# Patient Record
Sex: Female | Born: 1980 | Race: White | Hispanic: No | Marital: Married | Smoking: Former smoker
Health system: Southern US, Community
[De-identification: ages and names within clinical notes are randomized; demographics above are authoritative.]

## PROBLEM LIST (undated history)

## (undated) ENCOUNTER — Inpatient Hospital Stay (HOSPITAL_COMMUNITY): Payer: Self-pay

## (undated) DIAGNOSIS — M1990 Unspecified osteoarthritis, unspecified site: Secondary | ICD-10-CM

## (undated) DIAGNOSIS — F32A Depression, unspecified: Secondary | ICD-10-CM

## (undated) DIAGNOSIS — F419 Anxiety disorder, unspecified: Secondary | ICD-10-CM

## (undated) DIAGNOSIS — F329 Major depressive disorder, single episode, unspecified: Secondary | ICD-10-CM

## (undated) DIAGNOSIS — K760 Fatty (change of) liver, not elsewhere classified: Secondary | ICD-10-CM

## (undated) DIAGNOSIS — T7840XA Allergy, unspecified, initial encounter: Secondary | ICD-10-CM

## (undated) DIAGNOSIS — D219 Benign neoplasm of connective and other soft tissue, unspecified: Secondary | ICD-10-CM

## (undated) DIAGNOSIS — K219 Gastro-esophageal reflux disease without esophagitis: Secondary | ICD-10-CM

## (undated) DIAGNOSIS — E669 Obesity, unspecified: Secondary | ICD-10-CM

## (undated) DIAGNOSIS — I10 Essential (primary) hypertension: Secondary | ICD-10-CM

## (undated) DIAGNOSIS — J42 Unspecified chronic bronchitis: Secondary | ICD-10-CM

## (undated) DIAGNOSIS — O42919 Preterm premature rupture of membranes, unspecified as to length of time between rupture and onset of labor, unspecified trimester: Secondary | ICD-10-CM

## (undated) DIAGNOSIS — E785 Hyperlipidemia, unspecified: Secondary | ICD-10-CM

## (undated) HISTORY — DX: Fatty (change of) liver, not elsewhere classified: K76.0

## (undated) HISTORY — DX: Hyperlipidemia, unspecified: E78.5

## (undated) HISTORY — DX: Allergy, unspecified, initial encounter: T78.40XA

## (undated) HISTORY — DX: Obesity, unspecified: E66.9

## (undated) HISTORY — DX: Depression, unspecified: F32.A

## (undated) HISTORY — DX: Gastro-esophageal reflux disease without esophagitis: K21.9

## (undated) HISTORY — DX: Major depressive disorder, single episode, unspecified: F32.9

## (undated) HISTORY — DX: Preterm premature rupture of membranes, unspecified as to length of time between rupture and onset of labor, unspecified trimester: O42.919

## (undated) HISTORY — DX: Unspecified osteoarthritis, unspecified site: M19.90

---

## 2000-08-13 ENCOUNTER — Encounter: Payer: Self-pay | Admitting: Emergency Medicine

## 2000-08-13 ENCOUNTER — Emergency Department (HOSPITAL_COMMUNITY): Admission: EM | Admit: 2000-08-13 | Discharge: 2000-08-13 | Payer: Self-pay | Admitting: Emergency Medicine

## 2000-12-23 ENCOUNTER — Other Ambulatory Visit: Admission: RE | Admit: 2000-12-23 | Discharge: 2000-12-23 | Payer: Self-pay | Admitting: Obstetrics and Gynecology

## 2005-08-27 ENCOUNTER — Other Ambulatory Visit: Admission: RE | Admit: 2005-08-27 | Discharge: 2005-08-27 | Payer: Self-pay | Admitting: Internal Medicine

## 2006-05-27 HISTORY — PX: CHOLECYSTECTOMY: SHX55

## 2006-08-08 ENCOUNTER — Encounter (INDEPENDENT_AMBULATORY_CARE_PROVIDER_SITE_OTHER): Payer: Self-pay | Admitting: *Deleted

## 2006-08-08 ENCOUNTER — Encounter (INDEPENDENT_AMBULATORY_CARE_PROVIDER_SITE_OTHER): Payer: Self-pay | Admitting: Specialist

## 2006-08-08 ENCOUNTER — Ambulatory Visit (HOSPITAL_COMMUNITY): Admission: RE | Admit: 2006-08-08 | Discharge: 2006-08-08 | Payer: Self-pay | Admitting: Surgery

## 2008-05-27 HISTORY — PX: WISDOM TOOTH EXTRACTION: SHX21

## 2008-07-12 ENCOUNTER — Encounter (INDEPENDENT_AMBULATORY_CARE_PROVIDER_SITE_OTHER): Payer: Self-pay | Admitting: *Deleted

## 2008-07-14 ENCOUNTER — Ambulatory Visit: Payer: Self-pay | Admitting: *Deleted

## 2008-07-14 DIAGNOSIS — F411 Generalized anxiety disorder: Secondary | ICD-10-CM | POA: Insufficient documentation

## 2008-07-14 DIAGNOSIS — K219 Gastro-esophageal reflux disease without esophagitis: Secondary | ICD-10-CM | POA: Insufficient documentation

## 2008-07-14 DIAGNOSIS — K921 Melena: Secondary | ICD-10-CM | POA: Insufficient documentation

## 2008-07-14 DIAGNOSIS — R5383 Other fatigue: Secondary | ICD-10-CM | POA: Insufficient documentation

## 2008-07-14 DIAGNOSIS — R002 Palpitations: Secondary | ICD-10-CM | POA: Insufficient documentation

## 2008-07-19 LAB — CONVERTED CEMR LAB
ALT: 49 units/L — ABNORMAL HIGH (ref 0–35)
AST: 33 units/L (ref 0–37)
Basophils Absolute: 0 10*3/uL (ref 0.0–0.1)
Basophils Relative: 0.6 % (ref 0.0–3.0)
CO2: 25 meq/L (ref 19–32)
Creatinine, Ser: 0.8 mg/dL (ref 0.4–1.2)
Eosinophils Absolute: 0.1 10*3/uL (ref 0.0–0.7)
GFR calc Af Amer: 109 mL/min
Hemoglobin: 14.7 g/dL (ref 12.0–15.0)
MCHC: 34.8 g/dL (ref 30.0–36.0)
MCV: 85.6 fL (ref 78.0–100.0)
Monocytes Absolute: 0.4 10*3/uL (ref 0.1–1.0)
Neutro Abs: 4.8 10*3/uL (ref 1.4–7.7)
RBC: 4.94 M/uL (ref 3.87–5.11)
Total Bilirubin: 1.3 mg/dL — ABNORMAL HIGH (ref 0.3–1.2)

## 2008-07-29 ENCOUNTER — Ambulatory Visit: Payer: Self-pay | Admitting: Gastroenterology

## 2008-07-29 DIAGNOSIS — R7401 Elevation of levels of liver transaminase levels: Secondary | ICD-10-CM | POA: Insufficient documentation

## 2008-07-29 DIAGNOSIS — K625 Hemorrhage of anus and rectum: Secondary | ICD-10-CM | POA: Insufficient documentation

## 2008-07-29 DIAGNOSIS — R109 Unspecified abdominal pain: Secondary | ICD-10-CM | POA: Insufficient documentation

## 2008-07-29 DIAGNOSIS — K5900 Constipation, unspecified: Secondary | ICD-10-CM | POA: Insufficient documentation

## 2008-07-29 DIAGNOSIS — R740 Nonspecific elevation of levels of transaminase and lactic acid dehydrogenase [LDH]: Secondary | ICD-10-CM

## 2008-08-01 ENCOUNTER — Telehealth: Payer: Self-pay | Admitting: Gastroenterology

## 2008-08-01 LAB — CONVERTED CEMR LAB
ALT: 52 units/L — ABNORMAL HIGH (ref 0–35)
Bilirubin, Direct: 0.2 mg/dL (ref 0.0–0.3)
Total Bilirubin: 1.2 mg/dL (ref 0.3–1.2)

## 2008-08-02 ENCOUNTER — Ambulatory Visit: Payer: Self-pay | Admitting: Gastroenterology

## 2008-08-03 ENCOUNTER — Ambulatory Visit (HOSPITAL_COMMUNITY): Admission: RE | Admit: 2008-08-03 | Discharge: 2008-08-03 | Payer: Self-pay | Admitting: Gastroenterology

## 2008-08-03 LAB — CONVERTED CEMR LAB
Prothrombin Time: 11.9 s (ref 10.9–13.3)
Saturation Ratios: 17.4 % — ABNORMAL LOW (ref 20.0–50.0)
Transferrin: 291.1 mg/dL (ref 212.0–360.0)

## 2008-08-04 ENCOUNTER — Telehealth: Payer: Self-pay | Admitting: Gastroenterology

## 2008-08-09 ENCOUNTER — Encounter (INDEPENDENT_AMBULATORY_CARE_PROVIDER_SITE_OTHER): Payer: Self-pay | Admitting: *Deleted

## 2008-08-09 LAB — CONVERTED CEMR LAB
A-1 Antitrypsin, Ser: 148 mg/dL (ref 83–200)
Ceruloplasmin: 35 mg/dL (ref 21–63)
Hepatitis B Surface Ag: NEGATIVE
Tissue Transglutaminase Ab, IgA: 0 units (ref <7)

## 2008-08-10 ENCOUNTER — Ambulatory Visit: Payer: Self-pay | Admitting: *Deleted

## 2008-08-10 DIAGNOSIS — K7689 Other specified diseases of liver: Secondary | ICD-10-CM | POA: Insufficient documentation

## 2008-08-10 DIAGNOSIS — E785 Hyperlipidemia, unspecified: Secondary | ICD-10-CM | POA: Insufficient documentation

## 2008-08-10 DIAGNOSIS — E669 Obesity, unspecified: Secondary | ICD-10-CM | POA: Insufficient documentation

## 2008-08-10 DIAGNOSIS — E876 Hypokalemia: Secondary | ICD-10-CM | POA: Insufficient documentation

## 2008-08-10 LAB — CONVERTED CEMR LAB
Cholesterol: 200 mg/dL (ref 0–200)
VLDL: 23.2 mg/dL (ref 0.0–40.0)

## 2008-08-16 ENCOUNTER — Telehealth (INDEPENDENT_AMBULATORY_CARE_PROVIDER_SITE_OTHER): Payer: Self-pay | Admitting: *Deleted

## 2008-09-01 ENCOUNTER — Telehealth (INDEPENDENT_AMBULATORY_CARE_PROVIDER_SITE_OTHER): Payer: Self-pay | Admitting: *Deleted

## 2009-05-27 HISTORY — PX: OTHER SURGICAL HISTORY: SHX169

## 2009-10-26 IMAGING — US US ABDOMEN COMPLETE
1 series · 14 of 25 positions shown · non-contrast
Comparison: None

CLINICAL DATA: Elevated LFTs.  Down pain and nausea.  Hypertension.

ABDOMEN ULTRASOUND
TECHNIQUE: Complete abdominal ultrasound examination was performed
including evaluation of the liver, gallbladder, bile ducts,
pancreas, kidneys, spleen, IVC, and abdominal aorta.

[Series 1: us abdomen complete · 0.33mm/px · 14 of 54 slices shown]
[im 1/54]
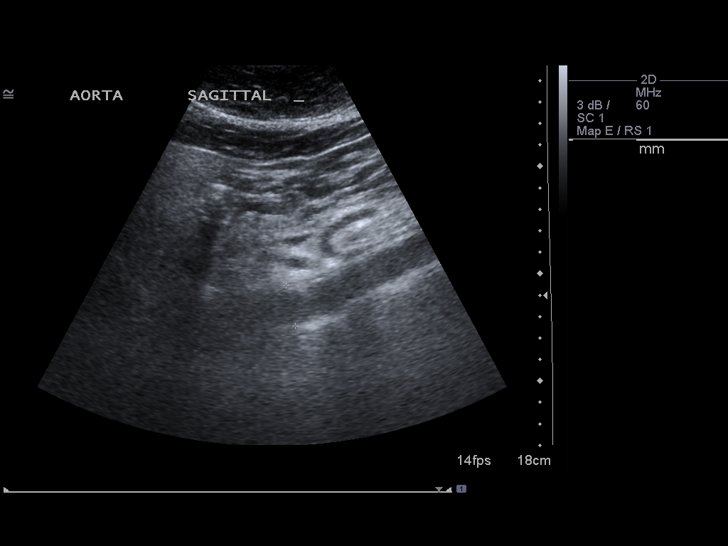
[im 5/54]
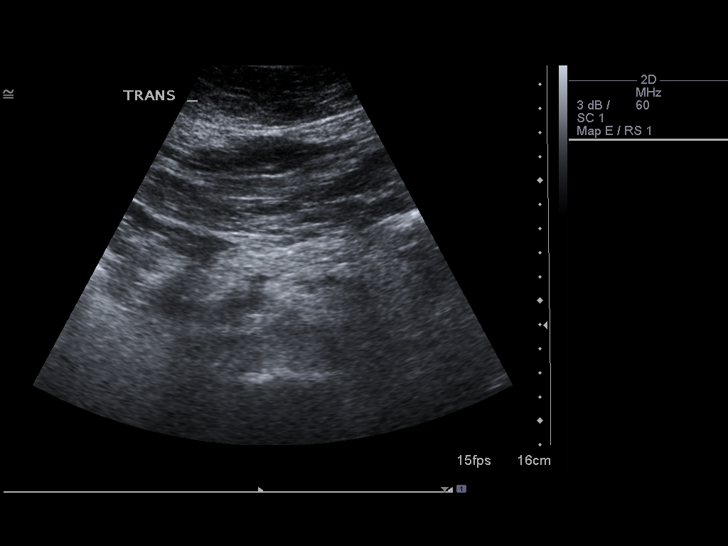
[im 9/54]
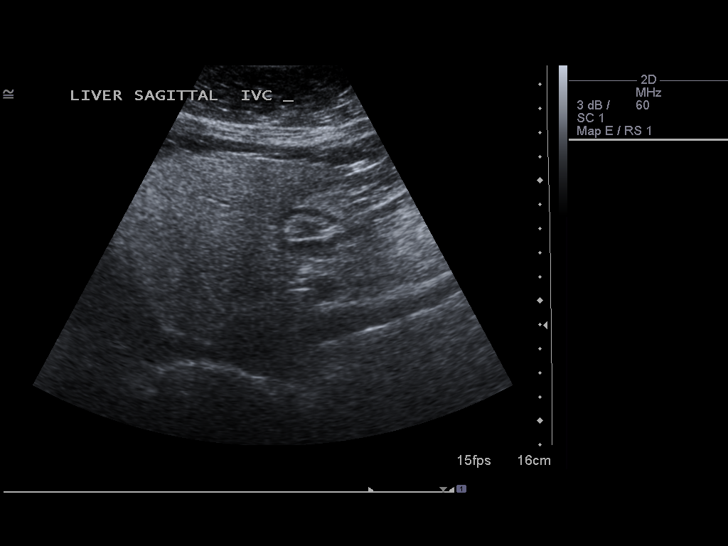
[im 14/54]
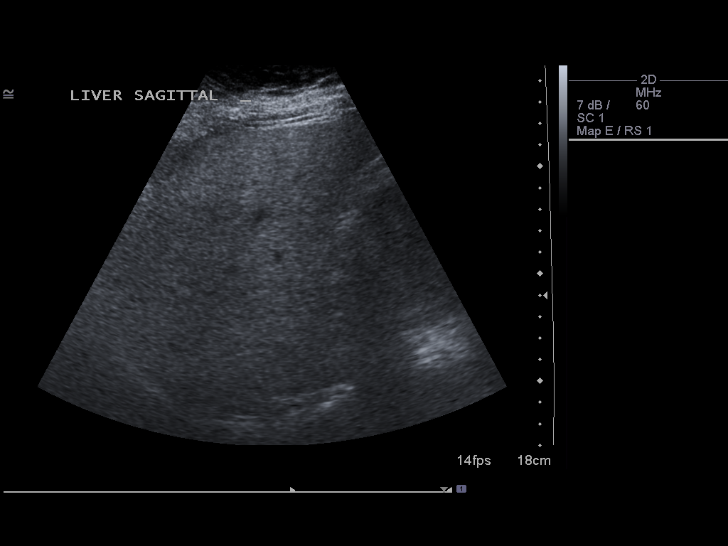
[im 18/54]
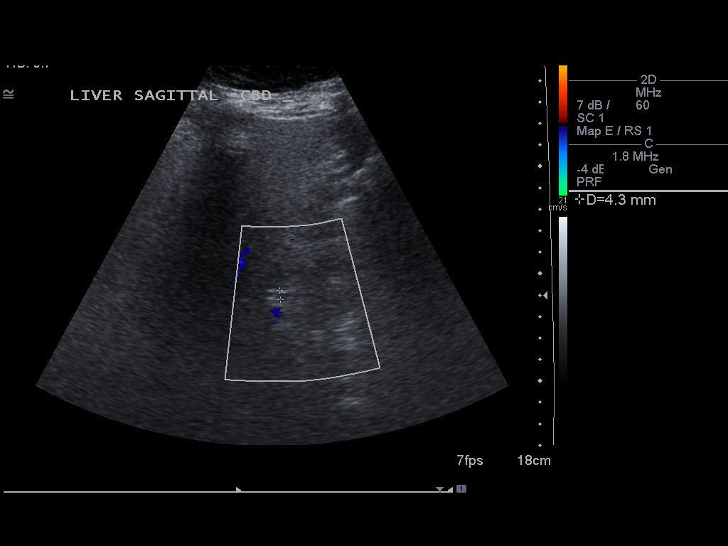
[im 20/54]
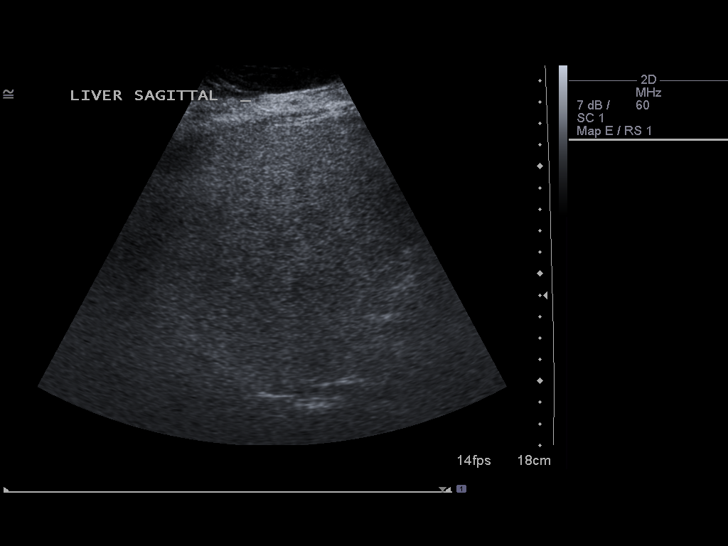
[im 25/54]
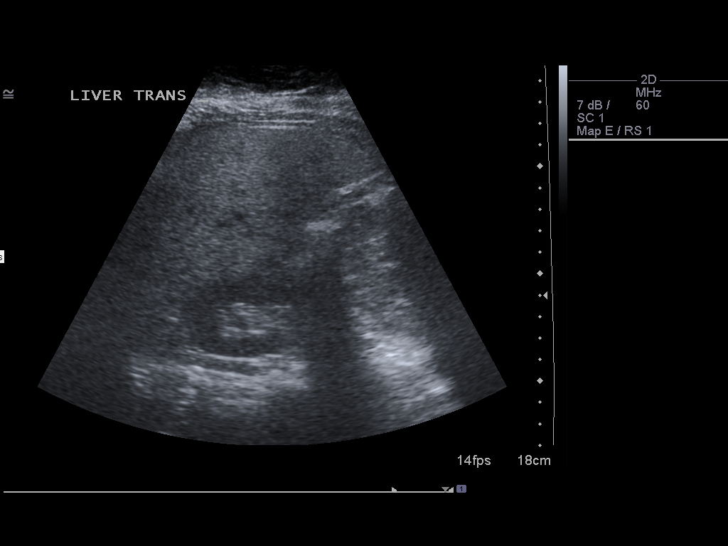
[im 29/54]
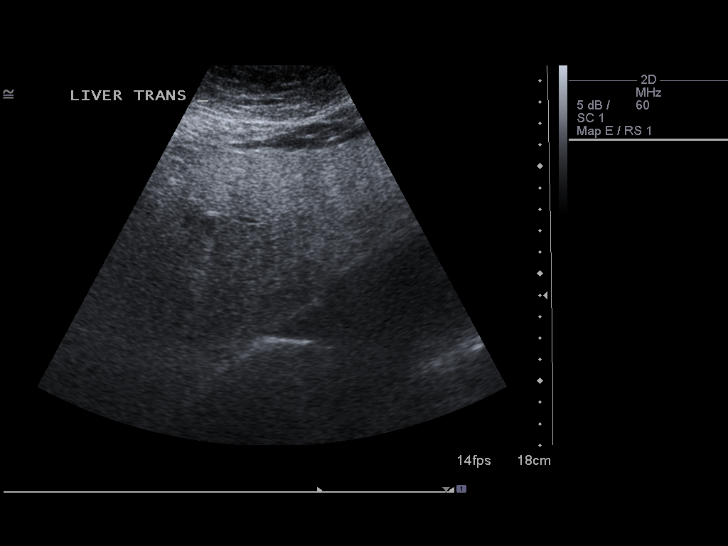
[im 34/54]
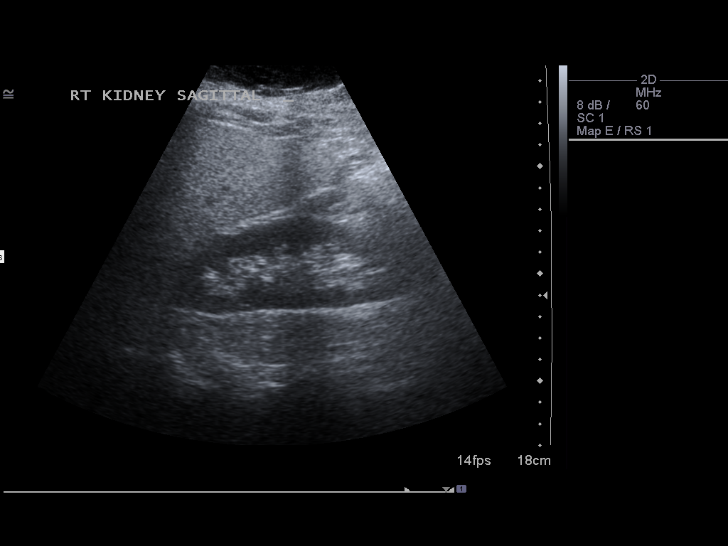
[im 36/54]
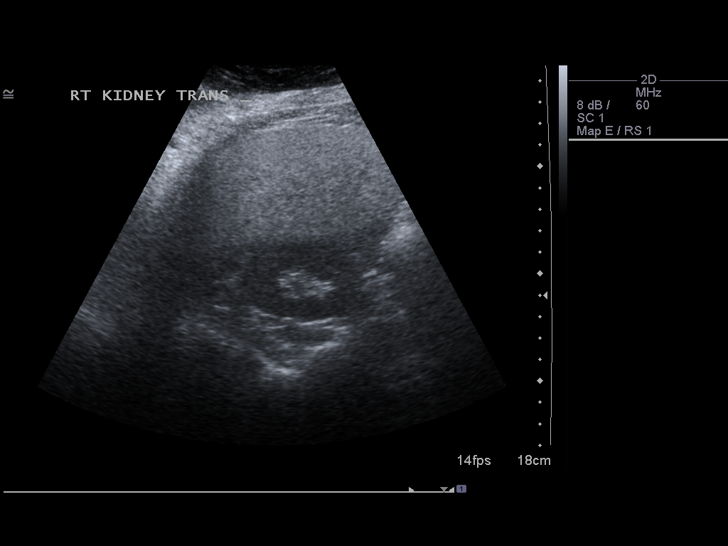
[im 40/54]
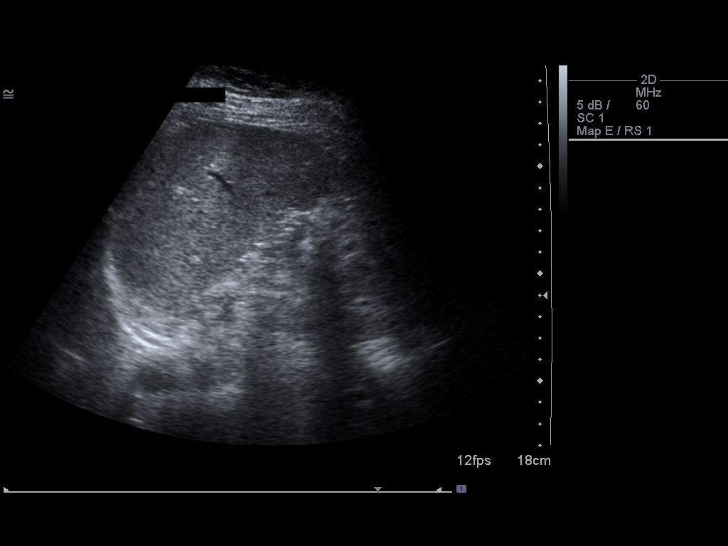
[im 45/54]
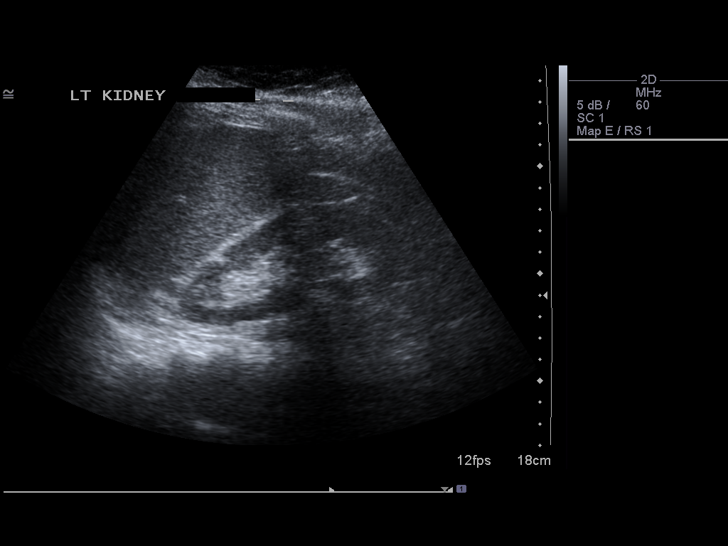
[im 49/54]
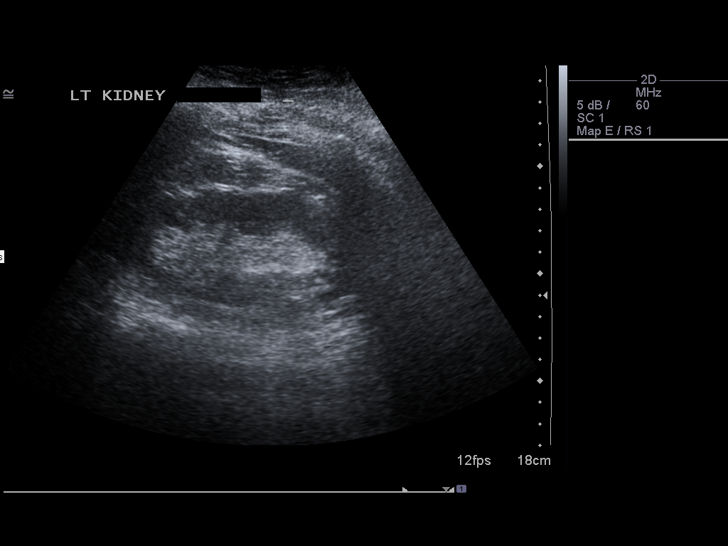
[im 54/54]
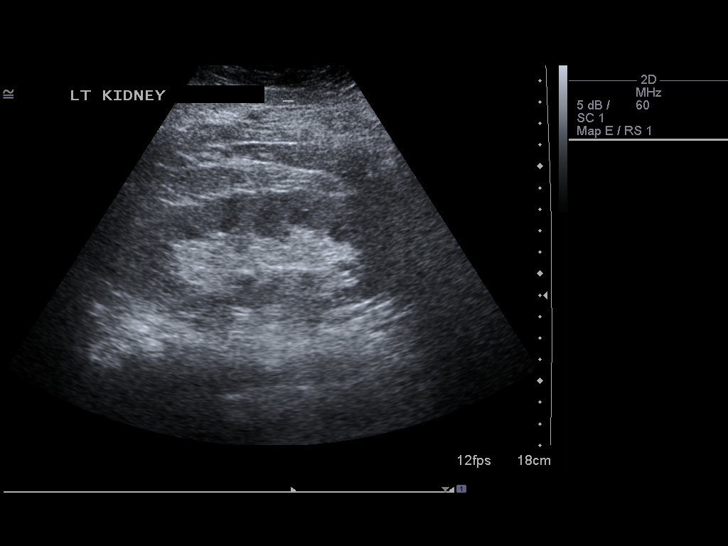

[14 of 25 positions shown; findings below may reference images not displayed]

FINDINGS: Cholecystectomy. No biliary ductal dilatation.  Common
duct measures 4.3 mm in diameter.  Hepatic echotexture is dense and
coarse compatible with diffuse hepatocellular disease, most likely
fatty infiltration.  Patent IVC.  Abdominal aortic maximum diameter
is 1.9 cm.  Spleen measures 11.5 cm in length.  The right and left
kidneys measure 12.5 cm and 12.7 cm in length, respectively.
IMPRESSION: Cholecystectomy.  No biliary ductal dilatation.  Fatty infiltration
of the liver.

## 2009-12-07 ENCOUNTER — Inpatient Hospital Stay (HOSPITAL_COMMUNITY): Admission: AD | Admit: 2009-12-07 | Discharge: 2009-12-13 | Payer: Self-pay | Admitting: Obstetrics and Gynecology

## 2009-12-07 ENCOUNTER — Encounter: Payer: Self-pay | Admitting: Obstetrics & Gynecology

## 2009-12-11 ENCOUNTER — Ambulatory Visit (HOSPITAL_COMMUNITY): Admission: RE | Admit: 2009-12-11 | Discharge: 2009-12-11 | Payer: Self-pay | Admitting: Obstetrics and Gynecology

## 2009-12-29 ENCOUNTER — Inpatient Hospital Stay (HOSPITAL_COMMUNITY): Admission: AD | Admit: 2009-12-29 | Discharge: 2009-12-31 | Payer: Self-pay | Admitting: Obstetrics and Gynecology

## 2010-01-25 DEATH — deceased

## 2010-07-16 ENCOUNTER — Ambulatory Visit: Payer: Self-pay | Admitting: Family Medicine

## 2010-08-10 LAB — CBC
HCT: 29 % — ABNORMAL LOW (ref 36.0–46.0)
HCT: 34.5 % — ABNORMAL LOW (ref 36.0–46.0)
Hemoglobin: 10.1 g/dL — ABNORMAL LOW (ref 12.0–15.0)
Hemoglobin: 12 g/dL (ref 12.0–15.0)
MCH: 31.1 pg (ref 26.0–34.0)
MCHC: 34.8 g/dL (ref 30.0–36.0)
MCV: 88.8 fL (ref 78.0–100.0)
MCV: 89.5 fL (ref 78.0–100.0)
RBC: 3.88 MIL/uL (ref 3.87–5.11)
WBC: 11.1 10*3/uL — ABNORMAL HIGH (ref 4.0–10.5)

## 2010-08-11 LAB — CBC
MCH: 32 pg (ref 26.0–34.0)
MCV: 88.8 fL (ref 78.0–100.0)
Platelets: 124 10*3/uL — ABNORMAL LOW (ref 150–400)
RDW: 13.5 % (ref 11.5–15.5)

## 2010-08-12 LAB — CBC
HCT: 30.9 % — ABNORMAL LOW (ref 36.0–46.0)
MCH: 31 pg (ref 26.0–34.0)
MCHC: 35 g/dL (ref 30.0–36.0)
RDW: 13.4 % (ref 11.5–15.5)

## 2010-08-12 LAB — MRSA PCR SCREENING: MRSA by PCR: NEGATIVE

## 2010-08-12 LAB — COMPREHENSIVE METABOLIC PANEL
ALT: 21 U/L (ref 0–35)
Calcium: 8.8 mg/dL (ref 8.4–10.5)
Creatinine, Ser: 0.44 mg/dL (ref 0.4–1.2)
GFR calc Af Amer: >60 mL/min (ref >60)
Glucose, Bld: 79 mg/dL (ref 70–99)
Sodium: 136 mEq/L (ref 135–145)
Total Protein: 6.1 g/dL (ref 6.0–8.3)

## 2010-10-12 NOTE — Op Note (Signed)
[name] [name], [name] [name] [name]           ACCOUNT NO.:  [id number]   MEDICAL RECORD NO.:  [id number]          PATIENT TYPE:  AMB   LOCATION:  DAY                          FACILITY:  [facility]   PHYSICIAN:  Wilmon Arms. Corliss Skains, M.D. DATE OF BIRTH:  13-May-1981   DATE OF PROCEDURE:  08/08/2006  DATE OF DISCHARGE:                               OPERATIVE REPORT   PREOPERATIVE DIAGNOSIS:  Chronic calculus cholecystitis   POSTOPERATIVE DIAGNOSIS:  Chronic calculus cholecystitis   PROCEDURE PERFORMED:  Laparoscopic cholecystectomy with intraoperative  cholangiogram.   SURGEON:  Wilmon Arms. Corliss Skains, M.D.   ASSISTANT:  Leonie Man, M.D.   ANESTHESIA:  General endotracheal.   INDICATIONS:  The patient is a 30 year old female who presented with  intermittent right upper quadrant pain associated with nausea and  vomiting.  Ultrasound showed a contracted gallbladder with multiple  gallstones.  There was no evidence of biliary ductal dilatation.  Her  preoperative blood work showed no sign of biliary obstruction.  Her  bilirubin was 1.1.  She presents now for elective cholecystectomy.   DESCRIPTION OF PROCEDURE:  The patient is brought to the operating room  and placed in the supine position on the operating table.  After an  adequate level of general anesthesia was obtained, the patient's abdomen  was prepped with Betadine and draped in a sterile fashion.  A time out  was taken to assure the proper patient and proper procedure.  Her  umbilicus was infiltrated 0.25% Marcaine.  A transverse incision was  made just below her umbilicus.  Dissection was carried down to the  fascia which was opened vertically.  The peritoneal cavity was bluntly  entered.  Stay sutures of 3-0 Vicryl was placed in the fascia.  The  Hasson cannula was inserted and secured with the stay suture.  Pneumoperitoneum was obtained by insufflating CO2 maintaining maximal  pressure of 15 mmHg.  The patient was positioned in reversed  Trendelenburg position and tilted to her left.  The laparoscope was  inserted.  There were no gross abnormalities with the liver or the  stomach.  A 10 mm port was placed in the subxiphoid position and two 5  mm ports in the right upper quadrant.   The gallbladder was grasped with a grasper.  There were multiple  adhesions to the gallbladder from the omentum.  This was bluntly  dissected away.  The gallbladder was quite long but contracted.  We  dissected around the cystic duct.  The cystic duct was ligated with a  clip distally.  A small opening was created on the cystic duct and the  [facility] cholangiogram catheter was inserted and secured with a clip.  A  cholangiogram was then obtained which showed good flow proximally and  distally in the biliary tree.  There was a long cystic duct.  Contrast  flowed easily into the duodenum.  The catheter was removed and the  cystic duct was ligated with a clip and divided.  The cystic artery was  also ligated with a clip and divided.  Cautery was used to remove the  gallbladder from the liver bed.  The  gallbladder fossa was thoroughly  cauterized for hemostasis.  The gallbladder was placed in EndoCatch sac  and removed through the umbilical port site.   We irrigated the right upper quadrant and hemostasis was good.  The  ports were removed under direct vision as the pneumoperitoneum was  released.  The stay suture at the umbilical fascia was tied down to  close the umbilical  opening.  4-0 Monocryl was used to close all the skin incisions in  subcuticular fashion.  Steri-Strips and clean dressings were applied.  The patient was then extubated and brought to the recovery room in  stable condition.  All sponge, instrument, and needle counts were  correct.      Wilmon Arms. Tsuei, M.D.  Electronically Signed     MKT/MEDQ  D:  08/08/2006  T:  08/09/2006  Job:  [id number]   cc:   Louanna Raw  Fax: [phone]

## 2011-10-01 ENCOUNTER — Telehealth: Payer: Self-pay | Admitting: Gastroenterology

## 2011-10-01 NOTE — Telephone Encounter (Signed)
Is this ok with you Dr Jacobs? 

## 2011-10-01 NOTE — Telephone Encounter (Signed)
OK 

## 2011-10-01 NOTE — Telephone Encounter (Signed)
That is okay with me if it is okay with Dr. Juanda Chance

## 2011-10-01 NOTE — Telephone Encounter (Signed)
Is this ok with you Dr Brodie? 

## 2011-10-14 NOTE — Telephone Encounter (Signed)
Tried to call patient to inform her of MDs decision but her number's are not accepting calls at this time

## 2012-03-13 ENCOUNTER — Emergency Department (HOSPITAL_COMMUNITY)
Admission: EM | Admit: 2012-03-13 | Discharge: 2012-03-13 | Disposition: A | Discharge status: Home / Self Care | Payer: 59 | Attending: Emergency Medicine | Admitting: Emergency Medicine

## 2012-03-13 ENCOUNTER — Encounter (HOSPITAL_COMMUNITY): Payer: Self-pay | Admitting: Emergency Medicine

## 2012-03-13 DIAGNOSIS — R109 Unspecified abdominal pain: Secondary | ICD-10-CM

## 2012-03-13 DIAGNOSIS — K5900 Constipation, unspecified: Secondary | ICD-10-CM

## 2012-03-13 DIAGNOSIS — K2970 Gastritis, unspecified, without bleeding: Secondary | ICD-10-CM

## 2012-03-13 DIAGNOSIS — K2990 Gastroduodenitis, unspecified, without bleeding: Secondary | ICD-10-CM | POA: Insufficient documentation

## 2012-03-13 HISTORY — DX: Anxiety disorder, unspecified: F41.9

## 2012-03-13 LAB — CBC WITH DIFFERENTIAL/PLATELET
Basophils Relative: 0 % (ref 0–1)
HCT: 37 % (ref 36.0–46.0)
Hemoglobin: 12.5 g/dL (ref 12.0–15.0)
Lymphocytes Relative: 16 % (ref 12–46)
Lymphs Abs: 1 10*3/uL (ref 0.7–4.0)
MCHC: 33.8 g/dL (ref 30.0–36.0)
Monocytes Absolute: 0.3 10*3/uL (ref 0.1–1.0)
Monocytes Relative: 5 % (ref 3–12)
Neutro Abs: 4.5 10*3/uL (ref 1.7–7.7)
Neutrophils Relative %: 77 % (ref 43–77)
RBC: 4.4 MIL/uL (ref 3.87–5.11)

## 2012-03-13 LAB — COMPREHENSIVE METABOLIC PANEL
Alkaline Phosphatase: 85 U/L (ref 39–117)
BUN: 14 mg/dL (ref 6–23)
CO2: 24 mEq/L (ref 19–32)
Chloride: 106 mEq/L (ref 96–112)
Creatinine, Ser: 1.05 mg/dL (ref 0.50–1.10)
GFR calc Af Amer: 80 mL/min — ABNORMAL LOW (ref >90)
GFR calc non Af Amer: 69 mL/min — ABNORMAL LOW (ref >90)
Glucose, Bld: 108 mg/dL — ABNORMAL HIGH (ref 70–99)
Potassium: 3.7 mEq/L (ref 3.5–5.1)
Total Bilirubin: 0.5 mg/dL (ref 0.3–1.2)

## 2012-03-13 MED ORDER — FAMOTIDINE 20 MG PO TABS: 20.0000 mg | ORAL_TABLET | Freq: Two times a day (BID) | ORAL | Status: DC

## 2012-03-13 MED ORDER — OMEPRAZOLE 20 MG PO CPDR: 20.0000 mg | DELAYED_RELEASE_CAPSULE | Freq: Every day | ORAL | Status: DC

## 2012-03-13 MED ORDER — POLYETHYLENE GLYCOL 3350 17 GM/SCOOP PO POWD: 17.0000 g | Freq: Every day | ORAL | Status: DC

## 2012-03-13 NOTE — ED Notes (Addendum)
Pt reports cramping like abdominal pain. RUQ and LUQ.  Tender on palpation. Non radiating. Woke her from her sleep at apporx midnight tonight. Described as dull, cramping pain. 2/10 currently, down from 10/10. Reports nausea when pain 1st began. None at present.  Reports constipation. A.O. X 4. Family at bedside.

## 2012-03-13 NOTE — ED Notes (Signed)
UNABLE TO GIVE URINE SPECIMEN AT TRIAGE , STATES " I CAN'T GO RIGHT NOW'.

## 2012-03-13 NOTE — ED Notes (Signed)
PT. REPORTS LOW ABDOMINAL PAIN / RIGHT ABDOMINAL PAIN RADIATING TO RIGHT LOWER BACK WITH NAUSEA /DIAPHORESIS ONSET THIS EVENING .

## 2012-03-13 NOTE — ED Provider Notes (Signed)
History     CSN: [id number]  Arrival date & time 03/13/12  0055   First MD Initiated Contact with Patient 03/13/12 [phone]      Chief Complaint  Patient presents with  . Abdominal Pain   HPI  History provided by the patient. Patient is a 31 year old female with history of anxiety and cholecystectomy who presents with complaints of epigastric and left upper quadrant abdominal pain and discomfort. Patient states symptoms began last evening around midnight after lying down in bed. Symptoms have been constant but seem to be improving significantly. He was initially sharp and burning but is now a dull pressure and ache sensation. She denies any radiation of the pain. She denies any belching or sour taste in mouth. She does report having the initial urge of having to have a bowel movement. Patient also reports having some general constipation-type symptoms. She states she has had bowel movements daily with last bowel movement yesterday but states she is felt "backed up" with hard stool. Patient denies having similar symptoms previously. She denies any recent illnesses. Denies any fever, chills or sweats. Denies any nausea vomiting symptoms. Denies any urinary complaints. Denies any dysuria, hematuria, urine for a severe flank pain.    Past Medical History  Diagnosis Date  . Anxiety     Past Surgical History  Procedure Date  . Cholecystectomy   . Cholecystectomy 2006    History reviewed. No pertinent family history.  History  Substance Use Topics  . Smoking status: Never Smoker   . Smokeless tobacco: Not on file  . Alcohol Use: No    OB History    Grav Para Term Preterm Abortions TAB SAB Ect Mult Living                  Review of Systems  Constitutional: Negative for fever, chills and appetite change.  Respiratory: Negative for cough.   Cardiovascular: Negative for chest pain and palpitations.  Gastrointestinal: Positive for abdominal pain and constipation. Negative for nausea,  vomiting, diarrhea and blood in stool.  Genitourinary: Negative for dysuria, frequency, hematuria, flank pain, vaginal bleeding and vaginal discharge.    Allergies  Clindamycin/lincomycin and Penicillins  Home Medications  No current outpatient prescriptions on file.  BP 139/86  Temp 98.3 F (36.8 C) (Oral)  Resp 18  SpO2 97%  LMP 03/08/2012  Physical Exam  Nursing note and vitals reviewed. Constitutional: She is oriented to person, place, and time. She appears well-developed and well-nourished. No distress.  HENT:  Head: Normocephalic.  Cardiovascular: Normal rate and regular rhythm.   Pulmonary/Chest: Effort normal and breath sounds normal. No respiratory distress. She has no wheezes. She has no rales.  Abdominal: Soft. There is tenderness in the epigastric area. There is no rebound, no guarding, no tenderness at McBurney's point and negative Murphy's sign.  Neurological: She is alert and oriented to person, place, and time.  Skin: Skin is warm and dry. No rash noted. No erythema.  Psychiatric: She has a normal mood and affect. Her behavior is normal.    ED Course  Procedures  Results for orders placed during the hospital encounter of 03/13/12  CBC WITH DIFFERENTIAL      Component Value Range   WBC 5.8  4.0 - 10.5 K/uL   RBC 4.40  3.87 - 5.11 MIL/uL   Hemoglobin 12.5  12.0 - 15.0 g/dL   HCT 28.4  13.2 - 44.0 %   MCV 84.1  78.0 - 100.0 fL   MCH  28.4  26.0 - 34.0 pg   MCHC 33.8  30.0 - 36.0 g/dL   RDW 08.6  57.8 - 46.9 %   Platelets 171  150 - 400 K/uL   Neutrophils Relative 77  43 - 77 %   Neutro Abs 4.5  1.7 - 7.7 K/uL   Lymphocytes Relative 16  12 - 46 %   Lymphs Abs 1.0  0.7 - 4.0 K/uL   Monocytes Relative 5  3 - 12 %   Monocytes Absolute 0.3  0.1 - 1.0 K/uL   Eosinophils Relative 2  0 - 5 %   Eosinophils Absolute 0.1  0.0 - 0.7 K/uL   Basophils Relative 0  0 - 1 %   Basophils Absolute 0.0  0.0 - 0.1 K/uL  COMPREHENSIVE METABOLIC PANEL      Component Value  Range   Sodium 139  135 - 145 mEq/L   Potassium 3.7  3.5 - 5.1 mEq/L   Chloride 106  96 - 112 mEq/L   CO2 24  19 - 32 mEq/L   Glucose, Bld 108 (*) 70 - 99 mg/dL   BUN 14  6 - 23 mg/dL   Creatinine, Ser 6.29  0.50 - 1.10 mg/dL   Calcium 9.4  8.4 - 52.8 mg/dL   Total Protein 7.0  6.0 - 8.3 g/dL   Albumin 3.9  3.5 - 5.2 g/dL   AST 33  0 - 37 U/L   ALT 36 (*) 0 - 35 U/L   Alkaline Phosphatase 85  39 - 117 U/L   Total Bilirubin 0.5  0.3 - 1.2 mg/dL   GFR calc non Af Amer 69 (*) >90 mL/min   GFR calc Af Amer 80 (*) >90 mL/min       1. Abdominal pain   2. Gastritis   3. Constipation       MDM  4:20AM Pt seen and evaluated. Patient reports feeling significantly better. Labs have been unremarkable. I discussed findings with patient and treatment options. She has not provided a urine sample but does not wish for any further testing. She feels ready to return home.        Angus Seller, [place] 03/13/12 [phone]

## 2012-03-13 NOTE — ED Notes (Signed)
Pt. A.O. X 4. Ambulatory. NAD. Respirations even and regular. NAD. Skin warm, dry, intact. Verbalized understanding of medication administration. Verbalized need to follow up. No further questions at this time.

## 2012-03-13 NOTE — ED Provider Notes (Signed)
Medical screening examination/treatment/procedure(s) were conducted as a shared visit with non-physician practitioner(s) and myself.  I personally evaluated the patient during the encounter  Salik Grewell, MD 03/13/12 0805 

## 2012-07-01 ENCOUNTER — Telehealth: Payer: Self-pay | Admitting: Gastroenterology

## 2012-07-01 NOTE — Telephone Encounter (Signed)
Is this ok Dr Christella Hartigan?

## 2012-07-02 NOTE — Telephone Encounter (Signed)
Dr. Juanda Chance and I agreed that switch was ok last year, see cut and pasted note below:   ---------------------------------------------------------- [name] [name] 10/14/2011 9:51 AM Signed  Tried to call patient to inform her of MDs decision but her number's are not accepting calls at this time [name], MD 10/01/2011 1:34 PM Signed  Rip Harbour Chales Abrahams, CMA 10/01/2011 10:07 AM Signed  Is this ok with you Dr Juanda Chance? Rob Bunting, MD 10/01/2011 10:02 AM Signed  That is okay with me if it is okay with Dr. Docia Furl, CMA 10/01/2011 9:50 AM Signed  Is this ok with you Dr Christella Hartigan? ------------------------------------------------------------------

## 2012-07-07 ENCOUNTER — Encounter: Payer: Self-pay | Admitting: Internal Medicine

## 2012-07-07 NOTE — Telephone Encounter (Signed)
Lm for pt to call to schedule appt with Dr. Juanda Chance.

## 2012-07-07 NOTE — Telephone Encounter (Signed)
appt has been scheduled.

## 2012-07-22 ENCOUNTER — Encounter: Payer: Self-pay | Admitting: *Deleted

## 2012-09-01 ENCOUNTER — Encounter: Payer: Self-pay | Admitting: Internal Medicine

## 2012-09-01 ENCOUNTER — Ambulatory Visit (INDEPENDENT_AMBULATORY_CARE_PROVIDER_SITE_OTHER): Payer: 59 | Admitting: Internal Medicine

## 2012-09-01 VITALS — BP 112/80 | HR 80 | Ht 63.0 in | Wt 242.4 lb

## 2012-09-01 DIAGNOSIS — R1012 Left upper quadrant pain: Secondary | ICD-10-CM

## 2012-09-01 DIAGNOSIS — K625 Hemorrhage of anus and rectum: Secondary | ICD-10-CM

## 2012-09-01 DIAGNOSIS — K5900 Constipation, unspecified: Secondary | ICD-10-CM

## 2012-09-01 MED ORDER — MOVIPREP 100 G PO SOLR: 1.0000 | Freq: Once | ORAL | Status: DC

## 2012-09-01 MED ORDER — MAGNESIUM OXIDE -MG SUPPLEMENT 500 MG PO TABS: 2.0000 | ORAL_TABLET | Freq: Every day | ORAL | Status: DC

## 2012-09-01 MED ORDER — HYDROCORTISONE ACE-PRAMOXINE 2.5-1 % RE CREA: TOPICAL_CREAM | Freq: Three times a day (TID) | RECTAL | Status: DC | PRN

## 2012-09-01 NOTE — Patient Instructions (Addendum)
You have been scheduled for a colonoscopy with propofol. Please follow written instructions given to you at your visit today.  Please pick up your prep kit at the pharmacy within the next 1-3 days. If you use inhalers (even only as needed), please bring them with you on the day of your procedure.  We have sent the following medications to your pharmacy for you to pick up at your convenience: Analram-Use as needed for rectal irritation  Please purchase the following medications over the counter and take as directed: Magnesium Oxide 500 mg-2 tablets daily  Cc: Dr Bonnye Fava

## 2012-09-01 NOTE — Progress Notes (Signed)
[name] [name] 11-01-1980 MRN [id number]  History of Present Illness:  This is a 32 year old white female with complaints of gastroesophageal reflux, constipation and abdominal pain in the left upper quadrant. She is also complaining of bright red blood per rectum in small volume associated with straining. She was seen in the emergency room in October 2013 for the same complaints and her baseline labs were normal. She had a prior laparoscopic cholecystectomy in 2008 for cholelithiasis. She was last seen in our office in March 2010 for epigastric pain and dyspepsia. She has poor eating habits. She has been continuing to gain weight and is up to 242 lbs. There is no family history of inflammatory bowel disease. She claims that the MiraLax bloats her. She has occasional diarrhea and urgency. She was recently put on Protonix 40 mg daily which helps.   Past Medical History  Diagnosis Date  . Anxiety   . Fatty liver   . Depression     as a teenager  . GERD (gastroesophageal reflux disease)   . Obesity   . Hyperlipidemia    Past Surgical History  Procedure Laterality Date  . Cholecystectomy  2008    reports that she has quit smoking. She has never used smokeless tobacco. She reports that she does not drink alcohol or use illicit drugs. family history includes Heart disease in her father; Hypertension in her father; Kidney disease in her father; and Liver disease in her mother. Allergies  Allergen Reactions  . Clindamycin/Lincomycin Hives  . Penicillins Hives        Review of Systems:  The remainder of the 10 point ROS is negative except as outlined in H&P   Physical Exam: General appearance  Well developed, in no distress. Overweight. Raspy voice Eyes- non icteric. HEENT nontraumatic, normocephalic. Mouth no lesions, tongue papillated, no cheilosis. Neck supple without adenopathy, thyroid not enlarged, no carotid bruits, no JVD. Lungs Clear to auscultation bilaterally. Cor  normal S1, normal S2, regular rhythm, no murmur,  quiet precordium. Abdomen: Obese soft abdomen with tenderness in epigastrium and left upper quadrant. Rectal: Normal rectal sphincter tone. No external hemorrhoids. Small amount of Hemoccult negative stool. Extremities no pedal edema. Skin no lesions. Neurological alert and oriented x 3. Psychological normal mood and affect.  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 The low volume hematochezia associated with constipation is most likely related to hemorrhoids or anal fissure. We will proceed with a colonoscopy to rule out inflammatory bowel disease. I doubt she has diverticulosis or colitis. We have prescribed Analpram cream 2.5% to use for rectal soreness. Patient will start magnesium oxide 500 mg, 2 tablets daily, and a high-fiber, low-fat diet and she will increase exercise. We may have to adjust her laxative regimen.   09/01/2012 [name]

## 2012-09-18 ENCOUNTER — Encounter: Payer: Self-pay | Admitting: Internal Medicine

## 2012-09-18 ENCOUNTER — Ambulatory Visit (AMBULATORY_SURGERY_CENTER): Payer: 59 | Admitting: Internal Medicine

## 2012-09-18 VITALS — BP 101/59 | HR 77 | Temp 98.9°F | Resp 11 | Ht 63.0 in | Wt 242.0 lb

## 2012-09-18 DIAGNOSIS — K5900 Constipation, unspecified: Secondary | ICD-10-CM

## 2012-09-18 DIAGNOSIS — K625 Hemorrhage of anus and rectum: Secondary | ICD-10-CM

## 2012-09-18 DIAGNOSIS — K921 Melena: Secondary | ICD-10-CM

## 2012-09-18 DIAGNOSIS — R1012 Left upper quadrant pain: Secondary | ICD-10-CM

## 2012-09-18 MED ORDER — SODIUM CHLORIDE 0.9 % IV SOLN: 500.0000 mL | INTRAVENOUS | Status: DC

## 2012-09-18 NOTE — Progress Notes (Signed)
Patient did not experience any of the following events: a burn prior to discharge; a fall within the facility; wrong site/side/patient/procedure/implant event; or a hospital transfer or hospital admission upon discharge from the facility. (G8907) Patient did not have preoperative order for IV antibiotic SSI prophylaxis. (G8918)  

## 2012-09-18 NOTE — Op Note (Signed)
 [facility] [address] [place], [address]   COLONOSCOPY PROCEDURE REPORT  PATIENT: [name] [name], [name] [name].  MR#: [id number] BIRTHDATE: 10/16/1980 , 31  yrs. old GENDER: Female ENDOSCOPIST: Hart Carwin, MD REFERRED BY:  Dr Marge Duncans PROCEDURE DATE:  09/18/2012 PROCEDURE:   Colonoscopy, diagnostic ASA CLASS:   Class II INDICATIONS:hematochezia, Change in bowel habits, and abdominal pain in the upper left quadrant. MEDICATIONS: MAC sedation, administered by CRNA and Propofol (Diprivan) 360 mg IV  DESCRIPTION OF PROCEDURE:   After the risks and benefits and of the procedure were explained, informed consent was obtained.  A digital rectal exam revealed no abnormalities of the rectum.    The LB [id number]  endoscope was introduced through the anus and advanced to the cecum, which was identified by both the appendix and ileocecal valve .  The quality of the prep was good, using MoviPrep .  The instrument was then slowly withdrawn as the colon was fully examined.     COLON FINDINGS: A normal appearing cecum, ileocecal valve, and appendiceal orifice were identified.  The ascending, hepatic flexure, transverse, splenic flexure, descending, sigmoid colon and rectum appeared unremarkable.  No polyps or cancers were seen. Retroflexed views revealed no abnormalities.     The scope was then withdrawn from the patient and the procedure completed.  COMPLICATIONS: There were no complications. ENDOSCOPIC IMPRESSION: Normal colon mild anal irritation/superficial fissures,  RECOMMENDATIONS: treat constipation  with Mad Oxide 500mg  2 po qd or Miralax, high fiber diet Analpram cream 2.5 % tid prn Abdominal pain possibly due to constipation  REPEAT EXAM: age 43  cc:  _______________________________ eSignedHart Carwin, MD 09/18/2012 10:15 AM

## 2012-09-18 NOTE — Patient Instructions (Addendum)
Discharge instructions given with verbal understanding. Resume previous medications.YOU HAD AN ENDOSCOPIC PROCEDURE TODAY AT THE [facility]: Refer to the procedure report that was given to you for any specific questions about what was found during the examination.  If the procedure report does not answer your questions, please call your gastroenterologist to clarify.  If you requested that your care partner not be given the details of your procedure findings, then the procedure report has been included in a sealed envelope for you to review at your convenience later.  YOU SHOULD EXPECT: Some feelings of bloating in the abdomen. Passage of more gas than usual.  Walking can help get rid of the air that was put into your GI tract during the procedure and reduce the bloating. If you had a lower endoscopy (such as a colonoscopy or flexible sigmoidoscopy) you may notice spotting of blood in your stool or on the toilet paper. If you underwent a bowel prep for your procedure, then you may not have a normal bowel movement for a few days.  DIET: Your first meal following the procedure should be a light meal and then it is ok to progress to your normal diet.  A half-sandwich or bowl of soup is an example of a good first meal.  Heavy or fried foods are harder to digest and may make you feel nauseous or bloated.  Likewise meals heavy in dairy and vegetables can cause extra gas to form and this can also increase the bloating.  Drink plenty of fluids but you should avoid alcoholic beverages for 24 hours.  ACTIVITY: Your care partner should take you home directly after the procedure.  You should plan to take it easy, moving slowly for the rest of the day.  You can resume normal activity the day after the procedure however you should NOT DRIVE or use heavy machinery for 24 hours (because of the sedation medicines used during the test).    SYMPTOMS TO REPORT IMMEDIATELY: A gastroenterologist can be reached at  any hour.  During normal business hours, 8:30 AM to 5:00 PM Monday through Friday, call [phone].  After hours and on weekends, please call the GI answering service at [phone] who will take a message and have the physician on call contact you.   Following lower endoscopy (colonoscopy or flexible sigmoidoscopy):  Excessive amounts of blood in the stool  Significant tenderness or worsening of abdominal pains  Swelling of the abdomen that is new, acute  Fever of 100F or higher  FOLLOW UP: If any biopsies were taken you will be contacted by phone or by letter within the next 1-3 weeks.  Call your gastroenterologist if you have not heard about the biopsies in 3 weeks.  Our staff will call the home number listed on your records the next business day following your procedure to check on you and address any questions or concerns that you may have at that time regarding the information given to you following your procedure. This is a courtesy call and so if there is no answer at the home number and we have not heard from you through the emergency physician on call, we will assume that you have returned to your regular daily activities without incident.  SIGNATURES/CONFIDENTIALITY: You and/or your care partner have signed paperwork which will be entered into your electronic medical record.  These signatures attest to the fact that that the information above on your After Visit Summary has been reviewed and is understood.    Full responsibility of the confidentiality of this discharge information lies with you and/or your care-partner.  

## 2012-09-21 ENCOUNTER — Telehealth: Payer: Self-pay | Admitting: *Deleted

## 2012-09-21 NOTE — Telephone Encounter (Signed)
  Follow up Call-  Call back number 09/18/2012  Post procedure Call Back phone  # [phone]  Permission to leave phone message Yes     Patient questions:  Left message to call if necessary.

## 2013-04-01 ENCOUNTER — Other Ambulatory Visit: Payer: Self-pay

## 2013-05-24 DIAGNOSIS — IMO0001 Reserved for inherently not codable concepts without codable children: Secondary | ICD-10-CM | POA: Insufficient documentation

## 2013-08-31 ENCOUNTER — Encounter (HOSPITAL_COMMUNITY): Payer: Self-pay | Admitting: *Deleted

## 2013-08-31 ENCOUNTER — Encounter (HOSPITAL_COMMUNITY): Payer: Self-pay | Admitting: Pharmacist

## 2013-09-03 ENCOUNTER — Other Ambulatory Visit: Payer: Self-pay | Admitting: Obstetrics and Gynecology

## 2013-09-08 ENCOUNTER — Ambulatory Visit (HOSPITAL_COMMUNITY)
Admission: RE | Admit: 2013-09-08 | Discharge: 2013-09-08 | Disposition: A | Discharge status: Home / Self Care | Payer: 59 | Source: Ambulatory Visit | Attending: Obstetrics and Gynecology | Admitting: Obstetrics and Gynecology

## 2013-09-08 ENCOUNTER — Encounter (HOSPITAL_COMMUNITY): Payer: 59 | Admitting: Anesthesiology

## 2013-09-08 ENCOUNTER — Ambulatory Visit (HOSPITAL_COMMUNITY): Payer: 59

## 2013-09-08 ENCOUNTER — Encounter (HOSPITAL_COMMUNITY): Payer: Self-pay | Admitting: *Deleted

## 2013-09-08 ENCOUNTER — Encounter (HOSPITAL_COMMUNITY)
Admission: RE | Disposition: A | Discharge status: Home / Self Care | Payer: Self-pay | Source: Ambulatory Visit | Attending: Obstetrics and Gynecology

## 2013-09-08 ENCOUNTER — Ambulatory Visit (HOSPITAL_COMMUNITY): Payer: 59 | Admitting: Anesthesiology

## 2013-09-08 DIAGNOSIS — E785 Hyperlipidemia, unspecified: Secondary | ICD-10-CM | POA: Insufficient documentation

## 2013-09-08 DIAGNOSIS — F411 Generalized anxiety disorder: Secondary | ICD-10-CM | POA: Insufficient documentation

## 2013-09-08 DIAGNOSIS — K7689 Other specified diseases of liver: Secondary | ICD-10-CM | POA: Insufficient documentation

## 2013-09-08 DIAGNOSIS — Z87891 Personal history of nicotine dependence: Secondary | ICD-10-CM | POA: Insufficient documentation

## 2013-09-08 DIAGNOSIS — Z6841 Body Mass Index (BMI) 40.0 and over, adult: Secondary | ICD-10-CM | POA: Insufficient documentation

## 2013-09-08 DIAGNOSIS — Z8249 Family history of ischemic heart disease and other diseases of the circulatory system: Secondary | ICD-10-CM | POA: Insufficient documentation

## 2013-09-08 DIAGNOSIS — K219 Gastro-esophageal reflux disease without esophagitis: Secondary | ICD-10-CM | POA: Insufficient documentation

## 2013-09-08 DIAGNOSIS — O99210 Obesity complicating pregnancy, unspecified trimester: Secondary | ICD-10-CM

## 2013-09-08 DIAGNOSIS — O99340 Other mental disorders complicating pregnancy, unspecified trimester: Secondary | ICD-10-CM | POA: Insufficient documentation

## 2013-09-08 DIAGNOSIS — O3430 Maternal care for cervical incompetence, unspecified trimester: Secondary | ICD-10-CM | POA: Insufficient documentation

## 2013-09-08 DIAGNOSIS — E669 Obesity, unspecified: Secondary | ICD-10-CM | POA: Insufficient documentation

## 2013-09-08 HISTORY — DX: Essential (primary) hypertension: I10

## 2013-09-08 HISTORY — PX: LAPAROSCOPY: SHX197

## 2013-09-08 HISTORY — PX: ABDOMINAL CERCLAGE: SHX5384

## 2013-09-08 LAB — CBC
HCT: 39.4 % (ref 36.0–46.0)
HEMOGLOBIN: 13.9 g/dL (ref 12.0–15.0)
MCH: 29.4 pg (ref 26.0–34.0)
MCHC: 35.3 g/dL (ref 30.0–36.0)
MCV: 83.5 fL (ref 78.0–100.0)
PLATELETS: 150 10*3/uL (ref 150–400)
RBC: 4.72 MIL/uL (ref 3.87–5.11)
RDW: 12.7 % (ref 11.5–15.5)
WBC: 5.6 10*3/uL (ref 4.0–10.5)

## 2013-09-08 LAB — TYPE AND SCREEN
ABO/RH(D): A POS
Antibody Screen: NEGATIVE

## 2013-09-08 LAB — ABO/RH: ABO/RH(D): A POS

## 2013-09-08 SURGERY — CERCLAGE, CERVIX, ABDOMINAL APPROACH
Anesthesia: General | Site: Abdomen

## 2013-09-08 MED ORDER — BUPIVACAINE HCL (PF) 0.25 % IJ SOLN
INTRAMUSCULAR | Status: AC
  Filled 2013-09-08: qty 20

## 2013-09-08 MED ORDER — MEPERIDINE HCL 25 MG/ML IJ SOLN: 6.2500 mg | INTRAMUSCULAR | Status: DC | PRN

## 2013-09-08 MED ORDER — LACTATED RINGERS IV SOLN
INTRAVENOUS | Status: DC
  Administered 2013-09-08 (×6): via INTRAVENOUS

## 2013-09-08 MED ORDER — SUCCINYLCHOLINE CHLORIDE 20 MG/ML IJ SOLN
INTRAMUSCULAR | Status: DC | PRN
  Administered 2013-09-08: 100 mg via INTRAVENOUS

## 2013-09-08 MED ORDER — FENTANYL CITRATE 0.05 MG/ML IJ SOLN
25.0000 ug | INTRAMUSCULAR | Status: DC | PRN
  Administered 2013-09-08 (×2): 25 ug via INTRAVENOUS

## 2013-09-08 MED ORDER — PHENYLEPHRINE 40 MCG/ML (10ML) SYRINGE FOR IV PUSH (FOR BLOOD PRESSURE SUPPORT)
PREFILLED_SYRINGE | INTRAVENOUS | Status: AC
  Filled 2013-09-08: qty 5

## 2013-09-08 MED ORDER — OXYCODONE-ACETAMINOPHEN 5-325 MG PO TABS
ORAL_TABLET | ORAL | Status: DC
Start: 2013-09-08 — End: 2013-09-08
  Filled 2013-09-08: qty 1

## 2013-09-08 MED ORDER — CEFAZOLIN SODIUM-DEXTROSE 2-3 GM-% IV SOLR
INTRAVENOUS | Status: AC
  Filled 2013-09-08: qty 50

## 2013-09-08 MED ORDER — FENTANYL CITRATE 0.05 MG/ML IJ SOLN
INTRAMUSCULAR | Status: DC | PRN
  Administered 2013-09-08 (×4): 100 ug via INTRAVENOUS
  Administered 2013-09-08: 50 ug via INTRAVENOUS

## 2013-09-08 MED ORDER — PROMETHAZINE HCL 25 MG/ML IJ SOLN: 6.2500 mg | INTRAMUSCULAR | Status: DC | PRN

## 2013-09-08 MED ORDER — FENTANYL CITRATE 0.05 MG/ML IJ SOLN
INTRAMUSCULAR | Status: AC
  Filled 2013-09-08: qty 5

## 2013-09-08 MED ORDER — INDOMETHACIN 25 MG PO CAPS: 25.0000 mg | ORAL_CAPSULE | Freq: Four times a day (QID) | ORAL | Status: DC

## 2013-09-08 MED ORDER — BUPIVACAINE HCL 0.25 % IJ SOLN
INTRAMUSCULAR | Status: DC | PRN
  Administered 2013-09-08: 13 mL

## 2013-09-08 MED ORDER — PROPOFOL 10 MG/ML IV EMUL
INTRAVENOUS | Status: AC
  Filled 2013-09-08: qty 20

## 2013-09-08 MED ORDER — FENTANYL CITRATE 0.05 MG/ML IJ SOLN
INTRAMUSCULAR | Status: AC
  Administered 2013-09-08: 25 ug via INTRAVENOUS
  Filled 2013-09-08: qty 2

## 2013-09-08 MED ORDER — LIDOCAINE HCL (CARDIAC) 20 MG/ML IV SOLN
INTRAVENOUS | Status: DC | PRN
  Administered 2013-09-08: 50 mg via INTRAVENOUS

## 2013-09-08 MED ORDER — NEOSTIGMINE METHYLSULFATE 1 MG/ML IJ SOLN
INTRAMUSCULAR | Status: DC | PRN
  Administered 2013-09-08 (×2): 1 mg via INTRAVENOUS

## 2013-09-08 MED ORDER — OXYCODONE-ACETAMINOPHEN 5-325 MG PO TABS
1.0000 | ORAL_TABLET | ORAL | Status: DC | PRN
  Administered 2013-09-08: 1 via ORAL

## 2013-09-08 MED ORDER — SUCCINYLCHOLINE CHLORIDE 20 MG/ML IJ SOLN
INTRAMUSCULAR | Status: AC
  Filled 2013-09-08: qty 10

## 2013-09-08 MED ORDER — ONDANSETRON HCL 4 MG PO TABS: 4.0000 mg | ORAL_TABLET | Freq: Three times a day (TID) | ORAL | Status: DC | PRN

## 2013-09-08 MED ORDER — PROPOFOL 10 MG/ML IV BOLUS
INTRAVENOUS | Status: DC | PRN
  Administered 2013-09-08: 50 mg via INTRAVENOUS
  Administered 2013-09-08: 190 mg via INTRAVENOUS
  Administered 2013-09-08: 50 mg via INTRAVENOUS

## 2013-09-08 MED ORDER — INDOMETHACIN 50 MG RE SUPP
50.0000 mg | Freq: Once | RECTAL | Status: AC
  Administered 2013-09-08: 50 mg via RECTAL
  Filled 2013-09-08: qty 1

## 2013-09-08 MED ORDER — CEFAZOLIN SODIUM-DEXTROSE 2-3 GM-% IV SOLR: 2.0000 g | INTRAVENOUS | Status: DC

## 2013-09-08 MED ORDER — OXYCODONE-ACETAMINOPHEN 7.5-325 MG PO TABS: 1.0000 | ORAL_TABLET | ORAL | Status: DC | PRN

## 2013-09-08 MED ORDER — ROCURONIUM BROMIDE 100 MG/10ML IV SOLN
INTRAVENOUS | Status: DC | PRN
  Administered 2013-09-08: 10 mg via INTRAVENOUS
  Administered 2013-09-08: 5 mg via INTRAVENOUS
  Administered 2013-09-08 (×4): 10 mg via INTRAVENOUS

## 2013-09-08 MED ORDER — NEOSTIGMINE METHYLSULFATE 1 MG/ML IJ SOLN
INTRAMUSCULAR | Status: AC
  Filled 2013-09-08: qty 1

## 2013-09-08 MED ORDER — GLYCOPYRROLATE 0.2 MG/ML IJ SOLN
INTRAMUSCULAR | Status: DC | PRN
  Administered 2013-09-08 (×2): 0.2 mg via INTRAVENOUS

## 2013-09-08 MED ORDER — ROCURONIUM BROMIDE 100 MG/10ML IV SOLN
INTRAVENOUS | Status: AC
  Filled 2013-09-08: qty 1

## 2013-09-08 MED ORDER — GLYCOPYRROLATE 0.2 MG/ML IJ SOLN
INTRAMUSCULAR | Status: AC
  Filled 2013-09-08: qty 2

## 2013-09-08 MED ORDER — ONDANSETRON HCL 4 MG/2ML IJ SOLN
INTRAMUSCULAR | Status: DC | PRN
Start: 2013-09-08 — End: 2013-09-08
  Administered 2013-09-08: 4 mg via INTRAVENOUS

## 2013-09-08 MED ORDER — MIDAZOLAM HCL 2 MG/2ML IJ SOLN
INTRAMUSCULAR | Status: AC
  Filled 2013-09-08: qty 2

## 2013-09-08 MED ORDER — PHENYLEPHRINE HCL 10 MG/ML IJ SOLN
INTRAMUSCULAR | Status: DC | PRN
  Administered 2013-09-08 (×7): .08 mg via INTRAVENOUS
  Administered 2013-09-08: .04 mg via INTRAVENOUS

## 2013-09-08 MED ORDER — ONDANSETRON HCL 4 MG/2ML IJ SOLN
INTRAMUSCULAR | Status: AC
  Filled 2013-09-08: qty 2

## 2013-09-08 MED ORDER — LIDOCAINE HCL (CARDIAC) 20 MG/ML IV SOLN
INTRAVENOUS | Status: AC
  Filled 2013-09-08: qty 5

## 2013-09-08 SURGICAL SUPPLY — 53 items
BLADE SURG 11 STRL SS (BLADE) ×3 IMPLANT
CABLE HIGH FREQUENCY MONO STRZ (ELECTRODE) ×3 IMPLANT
CATH ROBINSON RED A/P 16FR (CATHETERS) IMPLANT
CLOTH BEACON ORANGE TIMEOUT ST (SAFETY) ×3 IMPLANT
DERMABOND ADVANCED (GAUZE/BANDAGES/DRESSINGS)
DERMABOND ADVANCED .7 DNX12 (GAUZE/BANDAGES/DRESSINGS) IMPLANT
DEVICE TROCAR PUNCTURE CLOSURE (ENDOMECHANICALS) ×6 IMPLANT
ELECT NEEDLE TIP 2.8 STRL (NEEDLE) IMPLANT
ELECT REM PT RETURN 9FT ADLT (ELECTROSURGICAL) ×3
ELECTRODE REM PT RTRN 9FT ADLT (ELECTROSURGICAL) ×2 IMPLANT
EVACUATOR SMOKE 8.L (FILTER) IMPLANT
FORCEPS CUTTING 33CM 5MM (CUTTING FORCEPS) IMPLANT
GAUZE SPONGE 4X4 16PLY XRAY LF (GAUZE/BANDAGES/DRESSINGS) ×3 IMPLANT
GLOVE BIO SURGEON STRL SZ8 (GLOVE) ×3 IMPLANT
GLOVE BIOGEL PI IND STRL 8.5 (GLOVE) ×6 IMPLANT
GLOVE BIOGEL PI INDICATOR 8.5 (GLOVE) ×3
GLOVE INDICATOR 8.5 STRL (GLOVE) IMPLANT
GOWN STRL REUS W/TWL LRG LVL3 (GOWN DISPOSABLE) ×12 IMPLANT
MANIPULATOR UTERINE 4.5 ZUMI (MISCELLANEOUS) IMPLANT
NEEDLE INSUFFLATION 120MM (ENDOMECHANICALS) ×3 IMPLANT
NS IRRIG 1000ML POUR BTL (IV SOLUTION) ×3 IMPLANT
PACK ABDOMINAL GYN (CUSTOM PROCEDURE TRAY) IMPLANT
PACK LAPAROSCOPY BASIN (CUSTOM PROCEDURE TRAY) ×3 IMPLANT
PAD OB MATERNITY 4.3X12.25 (PERSONAL CARE ITEMS) ×3 IMPLANT
PENCIL BUTTON HOLSTER BLD 10FT (ELECTRODE) ×3 IMPLANT
POUCH SPECIMEN RETRIEVAL 10MM (ENDOMECHANICALS) IMPLANT
PROTECTOR NERVE ULNAR (MISCELLANEOUS) ×3 IMPLANT
RETRACTOR LAPSCP 12X46 CVD (ENDOMECHANICALS) ×2 IMPLANT
RTRCTR LAPSCP 12X46 CVD (ENDOMECHANICALS) ×3
SCISSORS LAP 5X35 DISP (ENDOMECHANICALS) ×3 IMPLANT
SEPRAFILM MEMBRANE 5X6 (MISCELLANEOUS) IMPLANT
SET IRRIG TUBING LAPAROSCOPIC (IRRIGATION / IRRIGATOR) ×3 IMPLANT
SUT MERSILENE 5MM BP 1 12 (SUTURE) ×3 IMPLANT
SUT MNCRL AB 4-0 PS2 18 (SUTURE) IMPLANT
SUT PROLENE 0 CT 1 30 (SUTURE) IMPLANT
SUT SILK 2 0 SH (SUTURE) ×3 IMPLANT
SUT SILK 3 0 PS 2 18 (SUTURE) ×3 IMPLANT
SUT VIC AB 2-0 CT1 27 (SUTURE)
SUT VIC AB 2-0 CT1 TAPERPNT 27 (SUTURE) IMPLANT
SUT VIC AB 2-0 UR6 27 (SUTURE) ×3 IMPLANT
SUT VICRYL 0 TIES 12 18 (SUTURE) IMPLANT
SYR 20CC LL (SYRINGE) IMPLANT
SYR 50ML LL SCALE MARK (SYRINGE) IMPLANT
SYR TOOMEY 50ML (SYRINGE) IMPLANT
SYS LAPSCP GELPORT 120MM (MISCELLANEOUS)
SYSTEM LAPSCP GELPORT 120MM (MISCELLANEOUS) IMPLANT
TOWEL OR 17X24 6PK STRL BLUE (TOWEL DISPOSABLE) ×6 IMPLANT
TRAY FOLEY CATH 14FR (SET/KITS/TRAYS/PACK) ×3 IMPLANT
TROCAR OPTI TIP 5M 100M (ENDOMECHANICALS) ×9 IMPLANT
TROCAR XCEL DIL TIP R 11M (ENDOMECHANICALS) ×3 IMPLANT
TUBING FILTER THERMOFLATOR (ELECTROSURGICAL) ×3 IMPLANT
WARMER LAPAROSCOPE (MISCELLANEOUS) IMPLANT
WATER STERILE IRR 1000ML POUR (IV SOLUTION) ×3 IMPLANT

## 2013-09-08 NOTE — Anesthesia Postprocedure Evaluation (Signed)
  Anesthesia Post-op Note  Anesthesia Post Note  Patient: [name] [name]  Procedure(s) Performed: Procedure(s) (LRB): LAPAROSCOPIC TRANSABDOMINAL CERCLAGE (N/A) LAPAROSCOPY OPERATIVE  Anesthesia type: General  Patient location: PACU  Post pain: Pain level controlled  Post assessment: Post-op Vital signs reviewed  Last Vitals:  Filed Vitals:   09/08/13 1615  BP: 120/75  Pulse: 100  Temp:   Resp: 16    Post vital signs: Reviewed  Level of consciousness: sedated  Complications: No apparent anesthesia complications

## 2013-09-08 NOTE — Transfer of Care (Signed)
Immediate Anesthesia Transfer of Care Note  Patient: [name] [name]  Procedure(s) Performed: Procedure(s): LAPAROSCOPIC TRANSABDOMINAL CERCLAGE (N/A) LAPAROSCOPY OPERATIVE  Patient Location: PACU  Anesthesia Type:General  Level of Consciousness: awake  Airway & Oxygen Therapy: Patient Spontanous Breathing  Post-op Assessment: Report given to PACU RN  Post vital signs: stable  Filed Vitals:   09/08/13 0941  BP: 147/95  Pulse: 128  Temp: 36.5 C  Resp: 20    Complications: No apparent anesthesia complications

## 2013-09-08 NOTE — H&P (Addendum)
[name] [name] is a 33 y.o. female , originally referred to me by Dr. Helane Rima, for transabdominal cerclage for cervical insufficiency. After some discussion, the patient agreed that cerclage performed during pregnancy is a better option   Patient's last mens IUP at 11 weeks.  Contraception: none DES exposure: denies Blood transfusions: none Sexually transmitted diseases: no past history Previous GYN Procedures: Transvaginal cerclage  Last pap: normal  OB History: 2011 spontaneous vaginal delivery after cervical funneling. She was first hospi and had vaginal ble.  She states that a  Despite the cerclage, she delivered a  live born 1 lb. 2 female infant at 82 wk. Menstrual History: Menarche age: 20 No LMP recorded.    Past Medical History  Diagnosis Date  . Anxiety   . Fatty liver   . Depression     as a teenager  . GERD (gastroesophageal reflux disease)   . Obesity   . Hyperlipidemia   . Hypertension     pt not meds at this time  . Vaginal delivery 12/2013                    Past Surgical History  Procedure Laterality Date  . Cholecystectomy  2008  . Wisdom tooth extraction  2010  . Transvaginal cerclage  2011             Family History  Problem Relation Age of Onset  . Hypertension Father   . Kidney disease Father   . Heart disease Father   . Liver disease Mother    No hereditary disease.  No cancer of breast, ovary, uterus. No cutaneous leiomyomatosis or renal cell carcinoma.  History   Social History  . Marital Status: Married    Spouse Name: N/A    Number of Children: N/A  . Years of Education: N/A   Occupational History  . Schedule coordinator    Social History Main Topics  . Smoking status: Former Research scientist (life sciences)  . Smokeless tobacco: Never Used  . Alcohol Use: No  . Drug Use: No  . Sexual Activity: Not on file   Other Topics Concern  . Not on file   Social History Narrative  . No narrative on file    Allergies  Allergen Reactions  .  Clindamycin/Lincomycin Hives  . Penicillins Hives    No current facility-administered medications on file prior to encounter.   No current outpatient prescriptions on file prior to encounter.     Review of Systems  Constitutional: Negative.   HENT: Negative.   Eyes: Negative.   Respiratory: Negative.   Cardiovascular: Negative.   Gastrointestinal: Negative.   Genitourinary: Negative.   Musculoskeletal: Negative.   Skin: Negative.   Neurological: Negative.   Endo/Heme/Allergies: Negative.   Psychiatric/Behavioral: Negative.      Physical Exam  BP 147/95  Pulse 128  Temp(Src) 97.7 F (36.5 C) (Oral)  Resp 20  Ht 5\' 4"  (1.626 m)  Wt 112.492 kg (248 lb)  BMI 42.55 kg/m2  SpO2 100% Constitutional: She is oriented to person, place, and time. She appears well-developed and well-nourished.  HENT:  Head: Normocephalic and atraumatic.  Nose: Nose normal.  Mouth/Throat: Oropharynx is clear and moist. No oropharyngeal exudate.  Eyes: Conjunctivae normal and EOM are normal. Pupils are equal, round, and reactive to light. No scleral icterus.  Neck: Normal range of motion. Neck supple. No tracheal deviation present. No thyromegaly present.  Cardiovascular: Normal rate.   Respiratory: Effort normal and breath sounds normal.  GI:  Soft. Bowel sounds are normal. She exhibits no distension and no mass. There is no tenderness.  Lymphadenopathy:    She has no cervical adenopathy.  Neurological: She is alert and oriented to person, place, and time. She has normal reflexes.  Skin: Skin is warm.  Psychiatric: She has a normal mood and affect. Her behavior is normal. Judgment and thought content normal.       Assessment/Plan:  IUP at 11 weeks Cervical insufficiency, treated with emergency transvaginal cerclage, resulting in delivery at 25 weeks No history of prior cervical trauma/surgery  The patient is for laparoscopic transabdominal cerclage.  The benefits and risks of the  procedure as well as the anticipated success chances following the surgery were discussed in detail with the patient.  All her questions were answered.  The alternative option of placing another transvaginal cerclage electively was also discussed with the patient.

## 2013-09-08 NOTE — Discharge Instructions (Signed)
Cerclage, Care After Refer to this sheet in the next few weeks. These instructions provide you with information on caring for yourself after your procedure. Your health care provider may also give you more specific instructions. Your treatment has been planned according to current medical practices, but problems sometimes occur. Call your health care provider if you have any problems or questions after your procedure. WHAT TO EXPECT AFTER THE PROCEDURE  After your procedure, it is typical to have the following:  Abdominal cramping.  Vaginal spotting. HOME CARE INSTRUCTIONS   Only take over-the-counter or prescriptions medicines for pain, discomfort, or fever as directed by your health care provider.  Avoid physical activities and exercise until your health care provider says it is okay.  Do not douche or have sexual intercourse until your health care provider tells you it is okay.  Keep your follow-up surgical and prenatal appointments with your health care provider. SEEK MEDICAL CARE IF:   You have abnormal vaginal discharge.  You have a rash.  You become lightheaded or feel faint.  You have abdominal pain that is not controlled with pain medicine. SEEK IMMEDIATE MEDICAL CARE IF:   You develop vaginal bleeding.  You are leaking fluid or have a gush of fluid from the vagina.  You have a fever.  You faint.  You have uterine contractions.  You feel your baby is not moving as much as usual or cannot feel your baby move.  You have chest pain or shortness of breath. Document Released: 03/03/2013 Document Reviewed: 12/02/2012 .

## 2013-09-08 NOTE — Op Note (Addendum)
OPERATIVE NOTE  Preoperative diagnosis: Cervical insufficiency, intrauterine pregnancy at 11 weeks Postoperative diagnosis: Cervical insufficiency, intrauterine pregnancy at 11 weeks Procedure: Laparoscopy, transabdominal cervical isthmic cerclage, intraoperative ultrasound guidance Anesthesia: Gen. Endotracheal Surgeon:Pam Vanalstine Kerin Perna, MD Complications: None  Estimated blood loss: 200 cc Findings: On exam under anesthesia the cervix was closed but patulous, admitting 1 finger into the external os. On laparoscopy the liver edges were rounded, suggestive of fatty liver. The uterus was gravid both ovaries appeared normal. Intraoperative ultrasound transabdominally and transvaginally showed a singleton intrauterine pregnancy at length consistent with 11 weeks. There was fetal cardiac activity and movements.  Description of the procedure: The patient was placed in dorsal supine position and general endotracheal anesthesia was given. She was then placed in lithotomy position and the abdomen was prepped and draped inside manner. A Foley catheter was inserted into the bladder. An intraumbilical 5 mm vertical skin incision was made after preemptive anesthesia of all toes incisions with 0.25% bupivacaine. A Verress needle was inserted. A pneumoperitoneum was created with carbon dioxide. A 5 mm trocar and then a corresponding laparoscope with 30 angle was inserted and video laparoscopy was started.  Under direct visualization 2 more 5 mm lower quadrant incisions were made and corresponding trochars were placed. A fifth 12 mm trocar was placed in the left upper quadrant for the Endopaddle retractor.  Above findings were noted. A #5 Mersilene tape was prepared by cutting the needles off just at the swaged and and creating a loop of 2-0 silk at each end to allow carrying the end of the Mersilene tape through the tissue during the cerclage. This Mersilene tape was then dropped into the posterior cul-de-sac. The  patient was placed in Trendelenburg position and the uterus was gently pressed posteriorly and superiorly with the EndoPaddle retractor while the bladder flap was created with the electrosurgical needle, set at 46 W cutting current. After blunt and sharp dissection the cervical used junction could be well visualized, despite the adiposity of the viscera and the visceral peritoneum. Next a stab wound incision was made 1 cm from the midline immediately suprapubically and an Endo Close ligature carrier was passed through the abdominal wall and pain and at the right side of the cervical isthmic junction. At this point of passing the tip of the Endo Close through the parametrium the uterus was hammock on the EndoPaddle device and lifted anteriorly, allowing visualization of the posterior lower uterine segment. The tip of the Endo Close device was brought out posteriorly just above the medial insertion point of the right uterosacral ligaments onto the uterus. 1 end of the Mersilene tape was grasped with the Endo Close device and brought out anteriorly. The same steps were repeated with a new Endo Close device on the left anterior aspect of the cervico-isthmic junction, immediately medial to the uterine blood vessels, and the other end of the Mersilene tape was grasped with the ligature carrier device and brought out anteriorly. Intraoperative transvaginal ultrasound guidance confirmed correct extra chorionic placement of the cerclage, as well as the fetal viability, even though he noted that the right side of the stitch was about a centimeter higher, incorporating a portion of myometrium. When the cerclage suture was tied with a surgeon's knot followed by 4 additional knots under transvaginal ultrasound guidance again the correct placement was confirmed. The resulting cervical cerclage produced a cervical length of 4.4 cm. The AP and transverse diameters of the cerclage by ultrasound was 23 and 25 mm respectively. The  ends  of the Mersilene tape was trimmed off and removed from the pelvis. Good hemostasis was insured. The pelvis was copiously irrigated and aspirated. The gas was allowed to escape. A 0 Vicryl deep subcutaneous suture was placed on the 12 mm incision. All skin incisions were approximated with 4-0 Monocryl in subcuticular stitches. The patient tolerated the procedure well and was transferred to recovery room in satisfactory condition.  Vena Bassinger Kerin Perna

## 2013-09-08 NOTE — Anesthesia Preprocedure Evaluation (Addendum)
Anesthesia Evaluation  Patient identified by MRN, date of birth, ID band Patient awake    Reviewed: Allergy & Precautions, H&P , Patient's Chart, lab work & pertinent test results, reviewed documented beta blocker date and time   History of Anesthesia Complications Negative for: history of anesthetic complications  Airway Mallampati: II TM Distance: >3 FB Neck ROM: full    Dental   Pulmonary former smoker,  breath sounds clear to auscultation        Cardiovascular Exercise Tolerance: Good hypertension, Rhythm:regular Rate:Normal     Neuro/Psych    GI/Hepatic GERD-  ,  Endo/Other  Morbid obesity  Renal/GU      Musculoskeletal   Abdominal   Peds  Hematology   Anesthesia Other Findings   Reproductive/Obstetrics                         Anesthesia Physical Anesthesia Plan  ASA: III  Anesthesia Plan: General ETT   Post-op Pain Management:    Induction:   Airway Management Planned:   Additional Equipment:   Intra-op Plan:   Post-operative Plan:   Informed Consent: I have reviewed the patients History and Physical, chart, labs and discussed the procedure including the risks, benefits and alternatives for the proposed anesthesia with the patient or authorized representative who has indicated his/her understanding and acceptance.   Dental Advisory Given  Plan Discussed with: CRNA and Surgeon  Anesthesia Plan Comments:       Anesthesia Quick Evaluation

## 2013-09-08 NOTE — Progress Notes (Signed)
Dr Kerin Perna saw fetal heartrate with abd ultrasound. Kristine Royal, RN

## 2013-09-09 ENCOUNTER — Encounter (HOSPITAL_COMMUNITY): Payer: Self-pay | Admitting: Obstetrics and Gynecology

## 2013-10-02 ENCOUNTER — Inpatient Hospital Stay (HOSPITAL_COMMUNITY): Payer: 59

## 2013-10-02 ENCOUNTER — Encounter (HOSPITAL_COMMUNITY): Payer: Self-pay | Admitting: *Deleted

## 2013-10-02 ENCOUNTER — Inpatient Hospital Stay (HOSPITAL_COMMUNITY)
Admission: AD | Admit: 2013-10-02 | Discharge: 2013-10-02 | Disposition: A | Discharge status: Home / Self Care | Payer: 59 | Source: Ambulatory Visit | Attending: Obstetrics and Gynecology | Admitting: Obstetrics and Gynecology

## 2013-10-02 DIAGNOSIS — E860 Dehydration: Secondary | ICD-10-CM | POA: Insufficient documentation

## 2013-10-02 DIAGNOSIS — O26899 Other specified pregnancy related conditions, unspecified trimester: Secondary | ICD-10-CM

## 2013-10-02 DIAGNOSIS — R109 Unspecified abdominal pain: Secondary | ICD-10-CM

## 2013-10-02 DIAGNOSIS — O9989 Other specified diseases and conditions complicating pregnancy, childbirth and the puerperium: Secondary | ICD-10-CM

## 2013-10-02 DIAGNOSIS — E785 Hyperlipidemia, unspecified: Secondary | ICD-10-CM | POA: Insufficient documentation

## 2013-10-02 DIAGNOSIS — O99891 Other specified diseases and conditions complicating pregnancy: Secondary | ICD-10-CM | POA: Insufficient documentation

## 2013-10-02 DIAGNOSIS — Z87891 Personal history of nicotine dependence: Secondary | ICD-10-CM | POA: Insufficient documentation

## 2013-10-02 DIAGNOSIS — O3430 Maternal care for cervical incompetence, unspecified trimester: Secondary | ICD-10-CM | POA: Insufficient documentation

## 2013-10-02 DIAGNOSIS — K7689 Other specified diseases of liver: Secondary | ICD-10-CM | POA: Insufficient documentation

## 2013-10-02 DIAGNOSIS — K219 Gastro-esophageal reflux disease without esophagitis: Secondary | ICD-10-CM | POA: Insufficient documentation

## 2013-10-02 DIAGNOSIS — N898 Other specified noninflammatory disorders of vagina: Secondary | ICD-10-CM | POA: Insufficient documentation

## 2013-10-02 HISTORY — DX: Unspecified chronic bronchitis: J42

## 2013-10-02 HISTORY — DX: Benign neoplasm of connective and other soft tissue, unspecified: D21.9

## 2013-10-02 LAB — URINALYSIS, ROUTINE W REFLEX MICROSCOPIC
Bilirubin Urine: NEGATIVE
Glucose, UA: NEGATIVE mg/dL
Ketones, ur: >80 mg/dL — AB
Nitrite: NEGATIVE
PROTEIN: NEGATIVE mg/dL
Specific Gravity, Urine: >1.03 — ABNORMAL HIGH (ref 1.005–1.030)
UROBILINOGEN UA: 0.2 mg/dL (ref 0.0–1.0)
pH: 6 (ref 5.0–8.0)

## 2013-10-02 LAB — URINE MICROSCOPIC-ADD ON

## 2013-10-02 LAB — WET PREP, GENITAL
Clue Cells Wet Prep HPF POC: NONE SEEN
Trich, Wet Prep: NONE SEEN
YEAST WET PREP: NONE SEEN

## 2013-10-02 NOTE — MAU Note (Addendum)
Pt had an abdominal cerclage placed 2 weeks ago. Since then she has had increased vaginal discharge. About an hour ago a large clear mucusy clump of "something" came out. Pt denies pain or cramping Just reports pelvic pressure which she has had and has not changed in nature.

## 2013-10-02 NOTE — MAU Note (Signed)
Clot was clear mucous, has noted an increase in d/c- sometimes has blood in it. No pain just pressure

## 2013-10-02 NOTE — MAU Provider Note (Signed)
History     CSN: [id number]  Arrival date and time: 10/02/13 1434   None     Chief Complaint  Patient presents with  . Vaginal Discharge   HPI  RN note: Rhett Bannister, RN Registered Nurse Addendum  MAU Note Service date: 10/02/2013 3:12 PM   Pt had an abdominal cerclage placed 2 weeks ago. Since then she has had increased vaginal discharge. About an hour ago a large clear mucusy clump of "something" came out. Pt denies pain or cramping Just reports pelvic pressure which she has had and has not changed in nature     Past Medical History  Diagnosis Date  . Anxiety   . Fatty liver   . Depression     as a teenager  . GERD (gastroesophageal reflux disease)   . Obesity   . Hyperlipidemia   . Hypertension     pt not meds at this time  . Vaginal delivery 12/2013  . Chronic bronchitis   . Fibroid     Past Surgical History  Procedure Laterality Date  . Cholecystectomy  2008  . Wisdom tooth extraction  2010  . Transvaginal cerclage  2011  . Abdominal cerclage N/A 09/08/2013    Procedure: LAPAROSCOPIC TRANSABDOMINAL CERCLAGE;  Surgeon: Governor Specking, MD;  Location: [facility];  Service: Gynecology;  Laterality: N/A;  . Laparoscopy  09/08/2013    Procedure: LAPAROSCOPY OPERATIVE;  Surgeon: Governor Specking, MD;  Location: [facility];  Service: Gynecology;;    Family History  Problem Relation Age of Onset  . Hypertension Father   . Kidney disease Father   . Heart disease Father   . Liver disease Mother     History  Substance Use Topics  . Smoking status: Former Research scientist (life sciences)  . Smokeless tobacco: Never Used     Comment: quit  2003  . Alcohol Use: No    Allergies:  Allergies  Allergen Reactions  . Clindamycin/Lincomycin Hives  . Penicillins Hives    Prescriptions prior to admission  Medication Sig Dispense Refill  . indomethacin (INDOCIN) 25 MG capsule Take 1 capsule (25 mg total) by mouth 4 (four) times daily.  8 capsule  0  . ondansetron (ZOFRAN) 4 MG tablet Take 1  tablet (4 mg total) by mouth every 8 (eight) hours as needed for nausea or vomiting.  20 tablet  0  . oxyCODONE-acetaminophen (PERCOCET) 7.5-325 MG per tablet Take 1 tablet by mouth every 4 (four) hours as needed for pain.  30 tablet  0  . Progesterone 50 MG SUPP Place 1 suppository vaginally 2 (two) times daily.      . ranitidine (ZANTAC) 75 MG tablet Take 75 mg by mouth 2 (two) times daily.        Review of Systems  Constitutional: Negative for fever and chills.  Gastrointestinal: Positive for abdominal pain. Negative for nausea, vomiting, diarrhea and constipation.       Pressure feeling  Genitourinary: Negative for dysuria, urgency and frequency.   Physical Exam   Blood pressure 117/82, pulse 123, temperature 97.7 F (36.5 C), resp. rate 18, height 5\' 4"  (1.626 m), weight 109.408 kg (241 lb 3.2 oz).  Physical Exam  Nursing note and vitals reviewed. Constitutional: She is oriented to person, place, and time. She appears well-developed and well-nourished. No distress.  HENT:  Head: Normocephalic.  Eyes: Pupils are equal, round, and reactive to light.  Neck: Normal range of motion. Neck supple.  Cardiovascular: Normal rate.   Respiratory: Effort normal.  GI: Soft.  Genitourinary:  Large amount of mucousy cream colored discharge in vault; cervix visually appears closed and long; bimanual deferred External labia reddened  Musculoskeletal: Normal range of motion.  Neurological: She is alert and oriented to person, place, and time.  Skin: Skin is warm.  Psychiatric: She has a normal mood and affect.    MAU Course  Procedures Results for orders placed during the hospital encounter of 10/02/13 (from the past 24 hour(s))  WET PREP, GENITAL     Status: Abnormal   Collection Time    10/02/13  4:00 PM      Result Value Ref Range   Yeast Wet Prep HPF POC NONE SEEN  NONE SEEN   Trich, Wet Prep NONE SEEN  NONE SEEN   Clue Cells Wet Prep HPF POC NONE SEEN  NONE SEEN   WBC, Wet Prep  HPF POC MANY (*) NONE SEEN  URINALYSIS, ROUTINE W REFLEX MICROSCOPIC     Status: Abnormal   Collection Time    10/02/13  4:05 PM      Result Value Ref Range   Color, Urine YELLOW  YELLOW   APPearance HAZY (*) CLEAR   Specific Gravity, Urine >1.030 (*) 1.005 - 1.030   pH 6.0  5.0 - 8.0   Glucose, UA NEGATIVE  NEGATIVE mg/dL   Hgb urine dipstick LARGE (*) NEGATIVE   Bilirubin Urine NEGATIVE  NEGATIVE   Ketones, ur >80 (*) NEGATIVE mg/dL   Protein, ur NEGATIVE  NEGATIVE mg/dL   Urobilinogen, UA 0.2  0.0 - 1.0 mg/dL   Nitrite NEGATIVE  NEGATIVE   Leukocytes, UA MODERATE (*) NEGATIVE  URINE MICROSCOPIC-ADD ON     Status: Abnormal   Collection Time    10/02/13  4:05 PM      Result Value Ref Range   Squamous Epithelial / LPF FEW (*) RARE   WBC, UA 21-50  <3 WBC/hpf   RBC / HPF 0-2  <3 RBC/hpf   Bacteria, UA MANY (*) RARE  will send urine for culture [place] results: Cervical length 3.5 cm with normal appearance by transabdominal scan - cerclage visualized; Placenta posterior above cervical os; FHR 152 bpm AF subjectively WNL- largest pocket 3.6cm Discussed with Dr. Julien Girt- pt may f/u with scheduled appointment    Assessment and Plan  Abd pressure with incompetent cervix w/cerclage Normal cervical length and [place] Dehydration- PO fluids encouraged Hematuria- urine culture sent  West Pugh 10/02/2013, 3:49 PM

## 2013-10-04 LAB — CULTURE, OB URINE
Colony Count: 50000
Special Requests: NORMAL

## 2013-11-21 ENCOUNTER — Inpatient Hospital Stay (HOSPITAL_COMMUNITY)
Admission: AD | Admit: 2013-11-21 | Discharge: 2013-12-01 | DRG: 765 | Disposition: A | Discharge status: Home / Self Care | Payer: 59 | Source: Ambulatory Visit | Attending: Obstetrics and Gynecology | Admitting: Obstetrics and Gynecology

## 2013-11-21 ENCOUNTER — Encounter (HOSPITAL_COMMUNITY): Payer: Self-pay | Admitting: *Deleted

## 2013-11-21 ENCOUNTER — Inpatient Hospital Stay (HOSPITAL_COMMUNITY): Payer: 59

## 2013-11-21 DIAGNOSIS — J42 Unspecified chronic bronchitis: Secondary | ICD-10-CM | POA: Diagnosis present

## 2013-11-21 DIAGNOSIS — N883 Incompetence of cervix uteri: Secondary | ICD-10-CM | POA: Diagnosis present

## 2013-11-21 DIAGNOSIS — K219 Gastro-esophageal reflux disease without esophagitis: Secondary | ICD-10-CM | POA: Diagnosis present

## 2013-11-21 DIAGNOSIS — O3410 Maternal care for benign tumor of corpus uteri, unspecified trimester: Secondary | ICD-10-CM

## 2013-11-21 DIAGNOSIS — D259 Leiomyoma of uterus, unspecified: Secondary | ICD-10-CM | POA: Diagnosis present

## 2013-11-21 DIAGNOSIS — O1002 Pre-existing essential hypertension complicating childbirth: Secondary | ICD-10-CM | POA: Diagnosis present

## 2013-11-21 DIAGNOSIS — O34599 Maternal care for other abnormalities of gravid uterus, unspecified trimester: Secondary | ICD-10-CM | POA: Diagnosis present

## 2013-11-21 DIAGNOSIS — K7689 Other specified diseases of liver: Secondary | ICD-10-CM | POA: Diagnosis present

## 2013-11-21 DIAGNOSIS — O321XX0 Maternal care for breech presentation, not applicable or unspecified: Secondary | ICD-10-CM | POA: Diagnosis present

## 2013-11-21 DIAGNOSIS — O99214 Obesity complicating childbirth: Secondary | ICD-10-CM

## 2013-11-21 DIAGNOSIS — E669 Obesity, unspecified: Secondary | ICD-10-CM | POA: Diagnosis present

## 2013-11-21 DIAGNOSIS — Z6841 Body Mass Index (BMI) 40.0 and over, adult: Secondary | ICD-10-CM

## 2013-11-21 DIAGNOSIS — O42919 Preterm premature rupture of membranes, unspecified as to length of time between rupture and onset of labor, unspecified trimester: Secondary | ICD-10-CM | POA: Diagnosis present

## 2013-11-21 DIAGNOSIS — O99344 Other mental disorders complicating childbirth: Secondary | ICD-10-CM | POA: Diagnosis present

## 2013-11-21 DIAGNOSIS — E785 Hyperlipidemia, unspecified: Secondary | ICD-10-CM | POA: Diagnosis present

## 2013-11-21 DIAGNOSIS — O411090 Infection of amniotic sac and membranes, unspecified, unspecified trimester, not applicable or unspecified: Secondary | ICD-10-CM | POA: Diagnosis present

## 2013-11-21 DIAGNOSIS — O3430 Maternal care for cervical incompetence, unspecified trimester: Secondary | ICD-10-CM | POA: Diagnosis present

## 2013-11-21 DIAGNOSIS — O4290 Premature rupture of membranes, unspecified as to length of time between rupture and onset of labor, unspecified weeks of gestation: Principal | ICD-10-CM | POA: Diagnosis present

## 2013-11-21 DIAGNOSIS — F411 Generalized anxiety disorder: Secondary | ICD-10-CM | POA: Diagnosis present

## 2013-11-21 DIAGNOSIS — D4959 Neoplasm of unspecified behavior of other genitourinary organ: Secondary | ICD-10-CM | POA: Diagnosis present

## 2013-11-21 DIAGNOSIS — Z87891 Personal history of nicotine dependence: Secondary | ICD-10-CM

## 2013-11-21 DIAGNOSIS — Z8249 Family history of ischemic heart disease and other diseases of the circulatory system: Secondary | ICD-10-CM

## 2013-11-21 LAB — AMNISURE RUPTURE OF MEMBRANE (ROM) NOT AT ARMC: Amnisure ROM: POSITIVE

## 2013-11-21 MED ORDER — TERBUTALINE SULFATE 1 MG/ML IJ SOLN
0.2500 mg | Freq: Once | INTRAMUSCULAR | Status: AC
  Administered 2013-11-21: 23:00:00 via SUBCUTANEOUS
  Filled 2013-11-21: qty 1

## 2013-11-21 NOTE — MAU Note (Signed)
Contractions started about an hour and a half ago. Pt reports vaginal bleeding started Friday has turned brown since then and is now red again as of an hour ago. Denies LOF.

## 2013-11-21 NOTE — MAU Provider Note (Signed)
History     CSN: [id number]  Arrival date and time: 11/21/13 2141   First Provider Initiated Contact with Patient 11/21/13 2209      Chief Complaint  Patient presents with  . Contractions   HPI  [name] [name] is a 33 y.o. G3P0110 at [redacted]w[redacted]d who presents today with cramping and contractions. She states that she was seen in the office on Friday due to bleeding. She had an ultrasound done at that time, and had a normal cervical length. She had a fetal demise at 22 weeks with her last pregnancy, and had a cerclage placed with this pregnancy. She is also getting 17-p injections.  Past Medical History  Diagnosis Date  . Anxiety   . Fatty liver   . Depression     as a teenager  . GERD (gastroesophageal reflux disease)   . Obesity   . Hyperlipidemia   . Hypertension     pt not meds at this time  . Vaginal delivery 12/2013  . Chronic bronchitis   . Fibroid     Past Surgical History  Procedure Laterality Date  . Cholecystectomy  2008  . Wisdom tooth extraction  2010  . Transvaginal cerclage  2011  . Abdominal cerclage N/A 09/08/2013    Procedure: LAPAROSCOPIC TRANSABDOMINAL CERCLAGE;  Surgeon: Governor Specking, MD;  Location: [facility];  Service: Gynecology;  Laterality: N/A;  . Laparoscopy  09/08/2013    Procedure: LAPAROSCOPY OPERATIVE;  Surgeon: Governor Specking, MD;  Location: [facility];  Service: Gynecology;;    Family History  Problem Relation Age of Onset  . Hypertension Father   . Kidney disease Father   . Heart disease Father   . Liver disease Mother     History  Substance Use Topics  . Smoking status: Former Research scientist (life sciences)  . Smokeless tobacco: Never Used     Comment: quit  2003  . Alcohol Use: No    Allergies:  Allergies  Allergen Reactions  . Clindamycin/Lincomycin Hives  . Penicillins Hives    Prescriptions prior to admission  Medication Sig Dispense Refill  . pantoprazole (PROTONIX) 40 MG tablet Take 40 mg by mouth daily.      . progesterone 50 MG/ML  injection Inject 10 mg into the muscle once a week.        ROS Physical Exam   Blood pressure 116/75, pulse 102, temperature 97.9 F (36.6 C), temperature source Oral, resp. rate 20, SpO2 100.00%.  Physical Exam  Nursing note and vitals reviewed. Constitutional: She is oriented to person, place, and time. She appears well-developed and well-nourished. Distressed: tearful and appears very anxious   Cardiovascular: Normal rate.   Respiratory: Effort normal.  GI: Soft. There is no tenderness.  Genitourinary:   External: no lesion Vagina: small amount of brown blood seen  Cervix: pink, smooth, visually still very thick. Unable to do a digital exam due to patient not tolerating exam Uterus: AGA, FHT with doppler. Contractions palpate mild.    Neurological: She is alert and oriented to person, place, and time.  Skin: Skin is warm and dry.  Psychiatric: She has a normal mood and affect.    MAU Course  Procedures 2225: D/W Dr. Lynnette Caffey, will get [place] for cervical length, and give one dose of terbutaline 2250: Patient called out and states that she thinks her water broke. Large puddle of fluid of on the floor.  2312: D/W Dr. Lynnette Caffey, +amnisure, low AFI and cervical length of ~3cm. She will come in to see the  patient. 0030: Dr. Lynnette Caffey is on the unit to see the patient.   Results for orders placed during the hospital encounter of 11/21/13 (from the past 24 hour(s))  AMNISURE RUPTURE OF MEMBRANE (ROM)     Status: None   Collection Time    11/21/13 10:50 PM      Result Value Ref Range   Amnisure ROM POSITIVE        Assessment and Plan  PPROM Dr. Lynnette Caffey assumes care of the patient and will place admission orders  Mathis Bud 11/21/2013, 10:26 PM

## 2013-11-22 ENCOUNTER — Encounter (HOSPITAL_COMMUNITY): Payer: Self-pay | Admitting: *Deleted

## 2013-11-22 ENCOUNTER — Inpatient Hospital Stay (HOSPITAL_COMMUNITY): Payer: 59

## 2013-11-22 DIAGNOSIS — O42919 Preterm premature rupture of membranes, unspecified as to length of time between rupture and onset of labor, unspecified trimester: Secondary | ICD-10-CM | POA: Diagnosis present

## 2013-11-22 HISTORY — DX: Preterm premature rupture of membranes, unspecified as to length of time between rupture and onset of labor, unspecified trimester: O42.919

## 2013-11-22 LAB — URINALYSIS, ROUTINE W REFLEX MICROSCOPIC
Bilirubin Urine: NEGATIVE
GLUCOSE, UA: NEGATIVE mg/dL
KETONES UR: 15 mg/dL — AB
Nitrite: NEGATIVE
PROTEIN: NEGATIVE mg/dL
Specific Gravity, Urine: <1.005 — ABNORMAL LOW (ref 1.005–1.030)
Urobilinogen, UA: 0.2 mg/dL (ref 0.0–1.0)
pH: 6 (ref 5.0–8.0)

## 2013-11-22 LAB — COMPREHENSIVE METABOLIC PANEL
ALK PHOS: 114 U/L (ref 39–117)
ALT: 22 U/L (ref 0–35)
AST: 20 U/L (ref 0–37)
Albumin: 2.9 g/dL — ABNORMAL LOW (ref 3.5–5.2)
BUN: 5 mg/dL — ABNORMAL LOW (ref 6–23)
CO2: 22 meq/L (ref 19–32)
Calcium: 9.4 mg/dL (ref 8.4–10.5)
Chloride: 102 mEq/L (ref 96–112)
Creatinine, Ser: 0.59 mg/dL (ref 0.50–1.10)
GLUCOSE: 118 mg/dL — AB (ref 70–99)
POTASSIUM: 3.7 meq/L (ref 3.7–5.3)
SODIUM: 138 meq/L (ref 137–147)
Total Bilirubin: 0.3 mg/dL (ref 0.3–1.2)
Total Protein: 6.5 g/dL (ref 6.0–8.3)

## 2013-11-22 LAB — CBC
HEMATOCRIT: 32.5 % — AB (ref 36.0–46.0)
Hemoglobin: 11.3 g/dL — ABNORMAL LOW (ref 12.0–15.0)
MCH: 29.9 pg (ref 26.0–34.0)
MCHC: 34.8 g/dL (ref 30.0–36.0)
MCV: 86 fL (ref 78.0–100.0)
Platelets: 174 10*3/uL (ref 150–400)
RBC: 3.78 MIL/uL — ABNORMAL LOW (ref 3.87–5.11)
RDW: 13.7 % (ref 11.5–15.5)
WBC: 12.5 10*3/uL — ABNORMAL HIGH (ref 4.0–10.5)

## 2013-11-22 LAB — URINE MICROSCOPIC-ADD ON

## 2013-11-22 LAB — TYPE AND SCREEN
ABO/RH(D): A POS
ANTIBODY SCREEN: NEGATIVE

## 2013-11-22 MED ORDER — ZOLPIDEM TARTRATE 5 MG PO TABS
5.0000 mg | ORAL_TABLET | Freq: Every evening | ORAL | Status: DC | PRN
Start: 2013-11-22 — End: 2013-11-28

## 2013-11-22 MED ORDER — DOCUSATE SODIUM 100 MG PO CAPS
100.0000 mg | ORAL_CAPSULE | Freq: Every day | ORAL | Status: DC
  Administered 2013-11-23 – 2013-11-24 (×2): 100 mg via ORAL
  Filled 2013-11-22 (×6): qty 1

## 2013-11-22 MED ORDER — PRENATAL MULTIVITAMIN CH
1.0000 | ORAL_TABLET | Freq: Every day | ORAL | Status: DC
  Filled 2013-11-22 (×4): qty 1

## 2013-11-22 MED ORDER — AZITHROMYCIN 250 MG PO TABS: 500.0000 mg | ORAL_TABLET | Freq: Every day | ORAL | Status: DC

## 2013-11-22 MED ORDER — DEXTROSE 5 % IV SOLN
2.0000 g | Freq: Two times a day (BID) | INTRAVENOUS | Status: DC
  Administered 2013-11-22 – 2013-11-27 (×13): 2 g via INTRAVENOUS
  Filled 2013-11-22 (×15): qty 2

## 2013-11-22 MED ORDER — ACETAMINOPHEN 325 MG PO TABS
650.0000 mg | ORAL_TABLET | ORAL | Status: DC | PRN
Start: 2013-11-22 — End: 2013-11-28

## 2013-11-22 MED ORDER — AZITHROMYCIN 250 MG PO TABS
500.0000 mg | ORAL_TABLET | Freq: Every day | ORAL | Status: AC
  Administered 2013-11-22 – 2013-11-23 (×2): 500 mg via ORAL
  Filled 2013-11-22 (×2): qty 2

## 2013-11-22 MED ORDER — SODIUM CHLORIDE 0.9 % IV SOLN: 250.0000 mL | INTRAVENOUS | Status: DC | PRN

## 2013-11-22 MED ORDER — SODIUM CHLORIDE 0.9 % IJ SOLN: 3.0000 mL | INTRAMUSCULAR | Status: DC | PRN

## 2013-11-22 MED ORDER — AZITHROMYCIN 250 MG PO TABS: 250.0000 mg | ORAL_TABLET | Freq: Every day | ORAL | Status: DC

## 2013-11-22 MED ORDER — SODIUM CHLORIDE 0.9 % IJ SOLN: 3.0000 mL | Freq: Two times a day (BID) | INTRAMUSCULAR | Status: DC

## 2013-11-22 MED ORDER — LACTATED RINGERS IV SOLN
INTRAVENOUS | Status: DC
  Administered 2013-11-22 – 2013-11-27 (×11): via INTRAVENOUS
  Administered 2013-11-27: 500 mL via INTRAVENOUS
  Administered 2013-11-28 (×3): via INTRAVENOUS

## 2013-11-22 MED ORDER — AZITHROMYCIN 250 MG PO TABS
250.0000 mg | ORAL_TABLET | Freq: Every day | ORAL | Status: DC
  Administered 2013-11-24 – 2013-11-27 (×4): 250 mg via ORAL
  Filled 2013-11-22 (×5): qty 1

## 2013-11-22 MED ORDER — PANTOPRAZOLE SODIUM 40 MG PO TBEC
40.0000 mg | DELAYED_RELEASE_TABLET | Freq: Every day | ORAL | Status: DC
  Administered 2013-11-22 – 2013-11-27 (×6): 40 mg via ORAL
  Filled 2013-11-22 (×6): qty 1

## 2013-11-22 MED ORDER — CALCIUM CARBONATE ANTACID 500 MG PO CHEW
2.0000 | CHEWABLE_TABLET | ORAL | Status: DC | PRN
  Filled 2013-11-22: qty 2

## 2013-11-22 NOTE — Progress Notes (Signed)
No C/O, no pain, no HA  VSS Afeb Lungs CTA Fundus NT FHT +  A/P  PPROM @ 21 4/7 weeks         Continue ATBs         Reviewed with patient delivery for infection/labor         MFM consult

## 2013-11-22 NOTE — Plan of Care (Signed)
Problem: Consults Goal: Birthing Suites Patient Information Press F2 to bring up selections list Outcome: Completed/Met Date Met:  11/22/13  Pt < [redacted] weeks EGA

## 2013-11-22 NOTE — H&P (Signed)
[name] [name] is a 33 y.o. female presenting to MAU with CTX which started last night around 8pm.  She was treated with terbutaline x 1 dose and had relief of CTX.  At around midnight, the patient called out with LOF and had confirmed rupture of membranes.  She denies f/c and abdominal pain.  No CTX currently.  Patient has h/o midtrimester loss secondary to cervical incompetence and had an abdominal cerclage placed this pregnancy by Dr. Kerin Perna 4/15.  She take 17-P weekly.  U/S shows AFI 4.47 with CL 3.6 cm.    Maternal Medical History:  Reason for admission: Rupture of membranes and contractions.   Contractions: Onset was 6-12 hours ago.    Prenatal Complications - Diabetes: none.    OB History   Grav Para Term Preterm Abortions TAB SAB Ect Mult Living   3 1 0 1 1 0 1 0 0 0      Past Medical History  Diagnosis Date  . Anxiety   . Fatty liver   . Depression     as a teenager  . GERD (gastroesophageal reflux disease)   . Obesity   . Hyperlipidemia   . Hypertension     pt not meds at this time  . Vaginal delivery 12/2013  . Chronic bronchitis   . Fibroid    Past Surgical History  Procedure Laterality Date  . Cholecystectomy  2008  . Wisdom tooth extraction  2010  . Transvaginal cerclage  2011  . Abdominal cerclage N/A 09/08/2013    Procedure: LAPAROSCOPIC TRANSABDOMINAL CERCLAGE;  Surgeon: Governor Specking, MD;  Location: [facility];  Service: Gynecology;  Laterality: N/A;  . Laparoscopy  09/08/2013    Procedure: LAPAROSCOPY OPERATIVE;  Surgeon: Governor Specking, MD;  Location: [facility];  Service: Gynecology;;   Family History: family history includes Heart disease in her father; Hypertension in her father; Kidney disease in her father; Liver disease in her mother. Social History:  reports that she has quit smoking. She has never used smokeless tobacco. She reports that she does not drink alcohol or use illicit drugs.   Prenatal Transfer Tool  Maternal Diabetes: No Genetic  Screening: Normal Maternal Ultrasounds/Referrals: Normal Fetal Ultrasounds or other Referrals:  None Maternal Substance Abuse:  No Significant Maternal Medications:  Meds include: Progesterone Significant Maternal Lab Results:  None Other Comments:  None  ROS    Blood pressure 116/75, pulse 102, temperature 97.9 F (36.6 C), temperature source Oral, resp. rate 20, SpO2 100.00%. Maternal Exam:  Abdomen: Patient reports no abdominal tenderness. Fundal height is c/w dates.   Estimated fetal weight is 1#.   Fetal presentation: breech  Introitus: Normal vulva. Normal vagina.  Amniotic fluid character: bloody.  Cervix: Cervix evaluated by sterile speculum exam.     Physical Exam  Constitutional: She is oriented to person, place, and time. She appears well-developed and well-nourished.  GI: Soft. There is no rebound and no guarding.  Genitourinary: Vagina normal and uterus normal.  Neurological: She is alert and oriented to person, place, and time.  Skin: Skin is warm and dry.  Psychiatric: She has a normal mood and affect. Her behavior is normal.   SSE: moderate amount of brown fluid.  No active bleeding.  Cervix visually closed.  Prenatal labs: ABO, Rh: --/--/A POS, A POS (04/15 1140) Antibody: NEG (04/15 1140) Rubella:   RPR:    HBsAg:    HIV:    GBS:     Assessment/Plan: 33yo G3P0110 at [redacted]w[redacted]d with PPROM -Patient  and family members are extensively counseled about PPROM including risk of infection, fetal demise, and labor all requiring delivery by C/S.  They understand that with latency, the fetus will likely have poor lung development.  They understand that if labor occurs or infection develops, delivery is necessary.  They are informed that BMZ is not indicated at this time but that antibiotics may be added.  -MFM consult placed for management recommendations and patient counseling.   MORRIS, MEGAN 11/22/2013, 1:40 AM

## 2013-11-22 NOTE — Progress Notes (Signed)
White count 12.5 and no physical exam findings of chorioamnionitis.  Will start latency antibiotics.  In light of patient's allergies to Clindamycin (hives) and PCN (hives), per conversation with Nori Riis in pharmacy, will start Cefotetan and Azith.  Linda Hedges, DO

## 2013-11-22 NOTE — Progress Notes (Signed)
Ur chart review completed.  

## 2013-11-22 NOTE — Consult Note (Signed)
MFM Note  33 year old G3P1A1 at 21+4 weeks presented last PM with contractions. While in the MAU, her membranes ruptured and she was admitted to the antenatal unit.  Her OB history is significant: G1: 2011; funneling at 16 weeks; rescue vaginal cerclage placed; in and out of hospital with vaginal bleeding; at ~ 22 with more bleeding, the baby delivered; previable GA G2: SAB at 10 weeks; no D&C  During this current pregnancy, an abdominal cerclage was placed at 11 weeks. She has experience some vaginal bleeding since the cerclage. No other complications until yesterday when contractions began. After SROM she was admitted and placed on latency antibiotics. So far, no evidence of infection.  [place] today: SIUP in breech presentation; measurements c/w LMP dating; no obvious structural abnormalities; a few small pockets of fluid, largest measuring 1.8 cms; EFW 417 grams (39th %tile)  We had a length discussion about previable PPROM with an abdominal cerclage. She definitely wants to continue the pregnancy and understands the risks including sepsis and death. If she had a complication like infection or bleeding and needed to be delivered, her only option would be by hysterotomy. Ideally, she would be delivered vaginally by D&E prior to viability, however, this surgery would require someone experienced with midterm D&Es. (A D&E could not be performed unless the fetus died or the mother's life was in danger). Then at 23 weeks, she desires aggressive management including urgent classical C/S. Ms. Debes would like the cerclage removed at the time of any abdominal surgery.  Assessment: 1) SIUP at 21+4 weeks 2) PPROM 3) S/P abdominal cerclage 4) H/O a previable vaginal delivery; had rescue cerclage at 16 weeks for funneling 5) H/O SAB without D&C  Recommendations: 1) Agree with latency antibiotics 2) BMZ just before 23 weeks 3) Would need to perform hysterotomy if there was an indication for delivery <  23 weeks 4) Weekly AFVs and growth in 3 weeks

## 2013-11-23 NOTE — Progress Notes (Signed)
[redacted]w[redacted]d  S// no c/o, min vag leaking  O//BP 101/66  Pulse 110  Temp(Src) 99.1 F (37.3 C) (Axillary)  Resp 18  Ht 5\' 4"  (1.626 m)  Wt 243 lb (110.224 kg)  BMI 41.69 kg/m2  SpO2 100%  CBC    Component Value Date/Time   WBC 12.5* 11/22/2013 0105   RBC 3.78* 11/22/2013 0105   HGB 11.3* 11/22/2013 0105   HCT 32.5* 11/22/2013 0105   PLT 174 11/22/2013 0105   MCV 86.0 11/22/2013 0105   MCH 29.9 11/22/2013 0105   MCHC 34.8 11/22/2013 0105   RDW 13.7 11/22/2013 0105   LYMPHSABS 1.0 03/13/2012 0128   MONOABS 0.3 03/13/2012 0128   EOSABS 0.1 03/13/2012 0128   BASOSABS 0.0 03/13/2012 0128    A+P//  See MFM plan, cont latency ABX for now

## 2013-11-24 NOTE — Progress Notes (Signed)
Patient ID: ADAYA GARMANY, female   DOB: [birth year], 33 y.o.   MRN: [id number]     Pt without complaints GFM Clear leaking of fluid Small blood occas VS    06/30 0700 07/01 0659 07/01 0700 07/01 0852   Most Recent        Temp (F) 97.4 -  98.4 98.2 -  98.2  98.2 (36.8)  07/01 0732    Pulse 94 -  102 99 -  99  99  07/01 0733    Resp 16 -  20 16 -  16  16  07/01 0733    BP 106/67 -  120/73 110/64 -  110/64  110/64  07/01 0733          FHR 140s  No decels Ctxs  Rare  Abd:  Gravid nontender Neg homans Bil  IUP at 21 6/7, Abd cerclage PPROM.  Reviewed MFM recommendations.  Questions answered Latency abx now BMX at 23 weeks C/S for delivery

## 2013-11-25 LAB — TYPE AND SCREEN
ABO/RH(D): A POS
Antibody Screen: NEGATIVE

## 2013-11-25 NOTE — Progress Notes (Signed)
Pt without complaints today.  No vb or ctx. Leaking clear fluid  AF< VSS.  + FHT Abd - soft, NT Ext - NT  A/P:  PPROM/abd cerclage Continue latency Abx Steroids at 23 wks c-section for delivery SCDs

## 2013-11-26 ENCOUNTER — Inpatient Hospital Stay (HOSPITAL_COMMUNITY): Payer: 59

## 2013-11-26 NOTE — Progress Notes (Signed)
[name] [name] is trying to stay positive and is doing the best she can with the situation. She has good family support and they have decorated her room to make it a more positive atmosphere.  She reported that prayer has been very helpful and we also shared a prayer together.  She was very appreciative of the visit.  [employer] Pager, [phone] 10:27 AM   11/26/13 1000  Clinical Encounter Type  Visited With Patient  Visit Type Spiritual support  Referral From Nurse  Spiritual Encounters  Spiritual Needs Emotional;Prayer

## 2013-11-26 NOTE — Progress Notes (Signed)
Patient is very stable Doing well Still leaking small amounts Can feel the baby moving No contractions Spotting Afebrile VSS General alert and oriented FHT are normal Abdomen  Is soft and non tender IMPRESSION: IUP at 22 1/7 Abdominal cerclage PPROM  PLAN: Patient is currently stable Continue antibiotics Steroids at 23 weeks C Section for delivery Check AFI today

## 2013-11-27 MED ORDER — TERBUTALINE SULFATE 1 MG/ML IJ SOLN
INTRAMUSCULAR | Status: AC
  Filled 2013-11-27: qty 1

## 2013-11-27 MED ORDER — TERBUTALINE SULFATE 1 MG/ML IJ SOLN
0.2500 mg | Freq: Once | INTRAMUSCULAR | Status: AC
  Administered 2013-11-27: 0.25 mg via SUBCUTANEOUS

## 2013-11-27 MED ORDER — LACTATED RINGERS IV BOLUS (SEPSIS)
500.0000 mL | Freq: Once | INTRAVENOUS | Status: AC
Start: 2013-11-27 — End: 2013-11-27

## 2013-11-27 NOTE — Progress Notes (Signed)
Patient is complaining of some mild cramping. She is not having some spotting Minimal leakage. Afebrile VSS General alert and oriented Abdomen is soft and non tender  IMPRESSION: IUP at 55 w 2 days PPROM Cerclage Review of ultrasound yesterday - patient has severe oligohydramnios No evidence of chorioamnionitis Patient desires to de everything for pregnancy Will give terbutaline x 1 for contractions  Continue antibiotics

## 2013-11-28 ENCOUNTER — Encounter (HOSPITAL_COMMUNITY)
Admission: AD | Disposition: A | Discharge status: Home / Self Care | Payer: Self-pay | Source: Ambulatory Visit | Attending: Obstetrics and Gynecology

## 2013-11-28 ENCOUNTER — Inpatient Hospital Stay (HOSPITAL_COMMUNITY): Payer: 59

## 2013-11-28 ENCOUNTER — Encounter (HOSPITAL_COMMUNITY): Payer: Self-pay | Admitting: Obstetrics

## 2013-11-28 ENCOUNTER — Inpatient Hospital Stay (HOSPITAL_COMMUNITY): Payer: 59 | Admitting: Anesthesiology

## 2013-11-28 ENCOUNTER — Encounter (HOSPITAL_COMMUNITY): Payer: 59 | Admitting: Anesthesiology

## 2013-11-28 DIAGNOSIS — N883 Incompetence of cervix uteri: Secondary | ICD-10-CM | POA: Diagnosis present

## 2013-11-28 LAB — TYPE AND SCREEN
ABO/RH(D): A POS
ANTIBODY SCREEN: NEGATIVE

## 2013-11-28 SURGERY — Surgical Case
Anesthesia: *Unknown

## 2013-11-28 SURGERY — Surgical Case
Anesthesia: Spinal | Site: Abdomen

## 2013-11-28 MED ORDER — MORPHINE SULFATE (PF) 0.5 MG/ML IJ SOLN
INTRAMUSCULAR | Status: DC | PRN
  Administered 2013-11-28: .1 mg via INTRATHECAL

## 2013-11-28 MED ORDER — MENTHOL 3 MG MT LOZG: 1.0000 | LOZENGE | OROMUCOSAL | Status: DC | PRN

## 2013-11-28 MED ORDER — MEPERIDINE HCL 25 MG/ML IJ SOLN
INTRAMUSCULAR | Status: AC
  Filled 2013-11-28: qty 1

## 2013-11-28 MED ORDER — LACTATED RINGERS IV SOLN
INTRAVENOUS | Status: DC
  Administered 2013-11-28 – 2013-11-29 (×2): via INTRAVENOUS

## 2013-11-28 MED ORDER — KETOROLAC TROMETHAMINE 30 MG/ML IJ SOLN: 30.0000 mg | Freq: Four times a day (QID) | INTRAMUSCULAR | Status: DC | PRN

## 2013-11-28 MED ORDER — WITCH HAZEL-GLYCERIN EX PADS: 1.0000 "application " | MEDICATED_PAD | CUTANEOUS | Status: DC | PRN

## 2013-11-28 MED ORDER — KETOROLAC TROMETHAMINE 30 MG/ML IJ SOLN: 30.0000 mg | Freq: Once | INTRAMUSCULAR | Status: DC

## 2013-11-28 MED ORDER — CITRIC ACID-SODIUM CITRATE 334-500 MG/5ML PO SOLN
ORAL | Status: AC
  Administered 2013-11-28: 30 mL
  Filled 2013-11-28: qty 15

## 2013-11-28 MED ORDER — FENTANYL CITRATE 0.05 MG/ML IJ SOLN
25.0000 ug | INTRAMUSCULAR | Status: DC | PRN
  Administered 2013-11-28 (×2): 50 ug via INTRAVENOUS

## 2013-11-28 MED ORDER — SENNOSIDES-DOCUSATE SODIUM 8.6-50 MG PO TABS
2.0000 | ORAL_TABLET | ORAL | Status: DC
  Administered 2013-11-28 – 2013-11-29 (×2): 2 via ORAL
  Filled 2013-11-28 (×3): qty 2

## 2013-11-28 MED ORDER — ZOLPIDEM TARTRATE 5 MG PO TABS: 5.0000 mg | ORAL_TABLET | Freq: Every evening | ORAL | Status: DC | PRN

## 2013-11-28 MED ORDER — NALBUPHINE HCL 10 MG/ML IJ SOLN: 5.0000 mg | INTRAMUSCULAR | Status: DC | PRN

## 2013-11-28 MED ORDER — FENTANYL CITRATE 0.05 MG/ML IJ SOLN: INTRAMUSCULAR | Status: DC | PRN

## 2013-11-28 MED ORDER — FENTANYL CITRATE 0.05 MG/ML IJ SOLN
INTRAMUSCULAR | Status: DC | PRN
  Administered 2013-11-28: 15 ug via INTRATHECAL

## 2013-11-28 MED ORDER — METOCLOPRAMIDE HCL 5 MG/ML IJ SOLN: 10.0000 mg | Freq: Three times a day (TID) | INTRAMUSCULAR | Status: DC | PRN

## 2013-11-28 MED ORDER — DIPHENHYDRAMINE HCL 25 MG PO CAPS: 25.0000 mg | ORAL_CAPSULE | Freq: Four times a day (QID) | ORAL | Status: DC | PRN

## 2013-11-28 MED ORDER — LANOLIN HYDROUS EX OINT: 1.0000 "application " | TOPICAL_OINTMENT | CUTANEOUS | Status: DC | PRN

## 2013-11-28 MED ORDER — ALPRAZOLAM 0.5 MG PO TABS: 0.5000 mg | ORAL_TABLET | Freq: Three times a day (TID) | ORAL | Status: DC | PRN

## 2013-11-28 MED ORDER — FENTANYL CITRATE 0.05 MG/ML IJ SOLN
INTRAMUSCULAR | Status: AC
  Filled 2013-11-28: qty 2

## 2013-11-28 MED ORDER — DIPHENHYDRAMINE HCL 50 MG/ML IJ SOLN: 12.5000 mg | INTRAMUSCULAR | Status: DC | PRN

## 2013-11-28 MED ORDER — SIMETHICONE 80 MG PO CHEW
80.0000 mg | CHEWABLE_TABLET | Freq: Three times a day (TID) | ORAL | Status: DC
  Administered 2013-11-28 – 2013-12-01 (×7): 80 mg via ORAL
  Filled 2013-11-28 (×7): qty 1

## 2013-11-28 MED ORDER — MEPERIDINE HCL 25 MG/ML IJ SOLN: 6.2500 mg | INTRAMUSCULAR | Status: DC | PRN

## 2013-11-28 MED ORDER — KETOROLAC TROMETHAMINE 60 MG/2ML IM SOLN
60.0000 mg | Freq: Once | INTRAMUSCULAR | Status: AC | PRN
  Administered 2013-11-28: 60 mg via INTRAMUSCULAR

## 2013-11-28 MED ORDER — CHLOROPROCAINE HCL 3 % IJ SOLN
INTRAMUSCULAR | Status: AC
  Filled 2013-11-28: qty 20

## 2013-11-28 MED ORDER — MORPHINE SULFATE 0.5 MG/ML IJ SOLN
INTRAMUSCULAR | Status: AC
  Filled 2013-11-28: qty 10

## 2013-11-28 MED ORDER — TETANUS-DIPHTH-ACELL PERTUSSIS 5-2.5-18.5 LF-MCG/0.5 IM SUSP: 0.5000 mL | Freq: Once | INTRAMUSCULAR | Status: DC

## 2013-11-28 MED ORDER — SODIUM CHLORIDE 0.9 % IJ SOLN: 3.0000 mL | INTRAMUSCULAR | Status: DC | PRN

## 2013-11-28 MED ORDER — BUTORPHANOL TARTRATE 1 MG/ML IJ SOLN
INTRAMUSCULAR | Status: AC
  Filled 2013-11-28: qty 1

## 2013-11-28 MED ORDER — IBUPROFEN 600 MG PO TABS
600.0000 mg | ORAL_TABLET | Freq: Four times a day (QID) | ORAL | Status: DC | PRN
  Administered 2013-11-28: 600 mg via ORAL
  Filled 2013-11-28: qty 1

## 2013-11-28 MED ORDER — ONDANSETRON HCL 4 MG/2ML IJ SOLN
INTRAMUSCULAR | Status: AC
  Filled 2013-11-28: qty 2

## 2013-11-28 MED ORDER — KETOROLAC TROMETHAMINE 60 MG/2ML IM SOLN
INTRAMUSCULAR | Status: AC
  Administered 2013-11-28: 60 mg via INTRAMUSCULAR
  Filled 2013-11-28: qty 2

## 2013-11-28 MED ORDER — SIMETHICONE 80 MG PO CHEW
80.0000 mg | CHEWABLE_TABLET | ORAL | Status: DC
Start: 2013-11-29 — End: 2013-11-28

## 2013-11-28 MED ORDER — LACTATED RINGERS IV SOLN
40.0000 [IU] | INTRAVENOUS | Status: DC | PRN
  Administered 2013-11-28: 40 [IU] via INTRAVENOUS

## 2013-11-28 MED ORDER — PRENATAL MULTIVITAMIN CH
1.0000 | ORAL_TABLET | Freq: Every day | ORAL | Status: DC
Start: 2013-11-29 — End: 2013-11-30
  Filled 2013-11-28: qty 1

## 2013-11-28 MED ORDER — OXYCODONE-ACETAMINOPHEN 5-325 MG PO TABS
1.0000 | ORAL_TABLET | ORAL | Status: DC | PRN
Start: 2013-11-28 — End: 2013-12-01
  Administered 2013-11-28 – 2013-11-30 (×5): 1 via ORAL
  Administered 2013-12-01: 2 via ORAL
  Filled 2013-11-28 (×5): qty 1
  Filled 2013-11-28: qty 2
  Filled 2013-11-28: qty 1

## 2013-11-28 MED ORDER — SCOPOLAMINE 1 MG/3DAYS TD PT72: 1.0000 | MEDICATED_PATCH | Freq: Once | TRANSDERMAL | Status: DC

## 2013-11-28 MED ORDER — ONDANSETRON HCL 4 MG/2ML IJ SOLN
INTRAMUSCULAR | Status: DC | PRN
  Administered 2013-11-28: 4 mg via INTRAVENOUS

## 2013-11-28 MED ORDER — NALOXONE HCL 1 MG/ML IJ SOLN
1.0000 ug/kg/h | INTRAVENOUS | Status: DC | PRN
  Filled 2013-11-28: qty 2

## 2013-11-28 MED ORDER — BUPIVACAINE IN DEXTROSE 0.75-8.25 % IT SOLN
INTRATHECAL | Status: DC | PRN
  Administered 2013-11-28: 1.5 mL via INTRATHECAL

## 2013-11-28 MED ORDER — MEPERIDINE HCL 25 MG/ML IJ SOLN
INTRAMUSCULAR | Status: DC | PRN
  Administered 2013-11-28 (×2): 12.5 mg via INTRAVENOUS

## 2013-11-28 MED ORDER — PHENYLEPHRINE HCL 10 MG/ML IJ SOLN
INTRAMUSCULAR | Status: AC
  Filled 2013-11-28: qty 1

## 2013-11-28 MED ORDER — METOCLOPRAMIDE HCL 5 MG/ML IJ SOLN: 10.0000 mg | Freq: Once | INTRAMUSCULAR | Status: DC | PRN

## 2013-11-28 MED ORDER — OXYCODONE-ACETAMINOPHEN 5-325 MG PO TABS: 1.0000 | ORAL_TABLET | ORAL | Status: DC | PRN

## 2013-11-28 MED ORDER — ONDANSETRON HCL 4 MG PO TABS: 4.0000 mg | ORAL_TABLET | ORAL | Status: DC | PRN

## 2013-11-28 MED ORDER — SIMETHICONE 80 MG PO CHEW: 80.0000 mg | CHEWABLE_TABLET | ORAL | Status: DC | PRN

## 2013-11-28 MED ORDER — LACTATED RINGERS IV BOLUS (SEPSIS)
500.0000 mL | Freq: Once | INTRAVENOUS | Status: AC
  Administered 2013-11-28: 500 mL via INTRAVENOUS

## 2013-11-28 MED ORDER — IBUPROFEN 600 MG PO TABS
600.0000 mg | ORAL_TABLET | Freq: Four times a day (QID) | ORAL | Status: DC
  Administered 2013-11-28 – 2013-11-30 (×9): 600 mg via ORAL
  Filled 2013-11-28 (×10): qty 1

## 2013-11-28 MED ORDER — OXYTOCIN 40 UNITS IN LACTATED RINGERS INFUSION - SIMPLE MED: 62.5000 mL/h | INTRAVENOUS | Status: DC

## 2013-11-28 MED ORDER — OXYTOCIN 10 UNIT/ML IJ SOLN
INTRAMUSCULAR | Status: AC
  Filled 2013-11-28: qty 4

## 2013-11-28 MED ORDER — FENTANYL CITRATE 0.05 MG/ML IJ SOLN
INTRAMUSCULAR | Status: DC | PRN
  Administered 2013-11-28: 85 ug via INTRAVENOUS

## 2013-11-28 MED ORDER — NALOXONE HCL 0.4 MG/ML IJ SOLN: 0.4000 mg | INTRAMUSCULAR | Status: DC | PRN

## 2013-11-28 MED ORDER — BUTORPHANOL TARTRATE 1 MG/ML IJ SOLN
1.0000 mg | Freq: Once | INTRAMUSCULAR | Status: AC
  Administered 2013-11-28: 1 mg via INTRAVENOUS

## 2013-11-28 MED ORDER — MORPHINE SULFATE (PF) 0.5 MG/ML IJ SOLN
INTRAMUSCULAR | Status: DC | PRN
  Administered 2013-11-28: 1 mg via INTRAVENOUS
  Administered 2013-11-28 (×2): 1 mg via EPIDURAL
  Administered 2013-11-28: .5 mg via EPIDURAL
  Administered 2013-11-28: 1 mg via EPIDURAL

## 2013-11-28 MED ORDER — FENTANYL CITRATE 0.05 MG/ML IJ SOLN
INTRAMUSCULAR | Status: AC
  Administered 2013-11-28: 50 ug via INTRAVENOUS
  Filled 2013-11-28: qty 2

## 2013-11-28 MED ORDER — DIPHENHYDRAMINE HCL 25 MG PO CAPS: 25.0000 mg | ORAL_CAPSULE | ORAL | Status: DC | PRN

## 2013-11-28 MED ORDER — ONDANSETRON HCL 4 MG/2ML IJ SOLN: 4.0000 mg | INTRAMUSCULAR | Status: DC | PRN

## 2013-11-28 MED ORDER — VANCOMYCIN HCL IN DEXTROSE 1-5 GM/200ML-% IV SOLN
1000.0000 mg | Freq: Once | INTRAVENOUS | Status: AC
  Administered 2013-11-28: 1000 mg via INTRAVENOUS
  Filled 2013-11-28: qty 200

## 2013-11-28 MED ORDER — ONDANSETRON HCL 4 MG/2ML IJ SOLN: 4.0000 mg | Freq: Three times a day (TID) | INTRAMUSCULAR | Status: DC | PRN

## 2013-11-28 MED ORDER — DIBUCAINE 1 % RE OINT: 1.0000 "application " | TOPICAL_OINTMENT | RECTAL | Status: DC | PRN

## 2013-11-28 MED ORDER — TRAMADOL HCL 50 MG PO TABS: 50.0000 mg | ORAL_TABLET | Freq: Four times a day (QID) | ORAL | Status: DC | PRN

## 2013-11-28 MED ORDER — PHENYLEPHRINE HCL 10 MG/ML IJ SOLN
INTRAMUSCULAR | Status: DC | PRN
  Administered 2013-11-28: 80 ug via INTRAVENOUS
  Administered 2013-11-28: 120 ug via INTRAVENOUS
  Administered 2013-11-28 (×4): 80 ug via INTRAVENOUS

## 2013-11-28 MED ORDER — DEXTROSE IN LACTATED RINGERS 5 % IV SOLN
INTRAVENOUS | Status: DC
  Administered 2013-11-28: 13:00:00 via INTRAVENOUS

## 2013-11-28 MED ORDER — DIPHENHYDRAMINE HCL 50 MG/ML IJ SOLN: 25.0000 mg | INTRAMUSCULAR | Status: DC | PRN

## 2013-11-28 MED ORDER — PHENYLEPHRINE 40 MCG/ML (10ML) SYRINGE FOR IV PUSH (FOR BLOOD PRESSURE SUPPORT)
PREFILLED_SYRINGE | INTRAVENOUS | Status: AC
  Filled 2013-11-28: qty 10

## 2013-11-28 SURGICAL SUPPLY — 33 items
BARRIER ADHS 3X4 INTERCEED (GAUZE/BANDAGES/DRESSINGS) IMPLANT
CLAMP CORD UMBIL (MISCELLANEOUS) IMPLANT
CLOTH BEACON ORANGE TIMEOUT ST (SAFETY) ×2 IMPLANT
CONTAINER PREFILL 10% NBF 15ML (MISCELLANEOUS) IMPLANT
DRAPE LG THREE QUARTER DISP (DRAPES) IMPLANT
DRSG OPSITE POSTOP 4X10 (GAUZE/BANDAGES/DRESSINGS) ×2 IMPLANT
DURAPREP 26ML APPLICATOR (WOUND CARE) ×2 IMPLANT
ELECT REM PT RETURN 9FT ADLT (ELECTROSURGICAL) ×2
ELECTRODE REM PT RTRN 9FT ADLT (ELECTROSURGICAL) ×1 IMPLANT
EXTRACTOR VACUUM M CUP 4 TUBE (SUCTIONS) IMPLANT
GLOVE BIO SURGEON STRL SZ 6.5 (GLOVE) ×2 IMPLANT
GOWN STRL REUS W/TWL LRG LVL3 (GOWN DISPOSABLE) ×4 IMPLANT
HEMOSTAT SURGICEL 2X3 (HEMOSTASIS) ×2 IMPLANT
HEMOSTAT SURGICEL 4X8 (HEMOSTASIS) ×4 IMPLANT
KIT ABG SYR 3ML LUER SLIP (SYRINGE) IMPLANT
NEEDLE HYPO 22GX1.5 SAFETY (NEEDLE) IMPLANT
NEEDLE HYPO 25X5/8 SAFETYGLIDE (NEEDLE) ×2 IMPLANT
NS IRRIG 1000ML POUR BTL (IV SOLUTION) ×2 IMPLANT
PACK C SECTION WH (CUSTOM PROCEDURE TRAY) ×2 IMPLANT
PAD ABD 7.5X8 STRL (GAUZE/BANDAGES/DRESSINGS) ×2 IMPLANT
PAD ABD 8X7 1/2 STERILE (GAUZE/BANDAGES/DRESSINGS) ×2 IMPLANT
PAD OB MATERNITY 4.3X12.25 (PERSONAL CARE ITEMS) ×2 IMPLANT
STAPLER VISISTAT 35W (STAPLE) IMPLANT
SUT CHROMIC 0 CTX 36 (SUTURE) ×8 IMPLANT
SUT PLAIN 0 NONE (SUTURE) IMPLANT
SUT PLAIN 2 0 XLH (SUTURE) IMPLANT
SUT VIC AB 0 CT1 27 (SUTURE) ×4
SUT VIC AB 0 CT1 27XBRD ANBCTR (SUTURE) ×4 IMPLANT
SUT VIC AB 4-0 KS 27 (SUTURE) ×2 IMPLANT
SYR CONTROL 10ML LL (SYRINGE) IMPLANT
TOWEL OR 17X24 6PK STRL BLUE (TOWEL DISPOSABLE) ×2 IMPLANT
TRAY FOLEY CATH 14FR (SET/KITS/TRAYS/PACK) ×2 IMPLANT
WATER STERILE IRR 1000ML POUR (IV SOLUTION) ×2 IMPLANT

## 2013-11-28 NOTE — Addendum Note (Signed)
Addendum created 11/28/13 1522 by Ignacia Bayley, CRNA   Modules edited: Notes Section   Notes Section:  File: [id number]

## 2013-11-28 NOTE — Anesthesia Procedure Notes (Signed)
Spinal  Patient location during procedure: OR Start time: 11/28/2013 4:09 AM Staffing Performed by: anesthesiologist  Preanesthetic Checklist Completed: patient identified, site marked, surgical consent, pre-op evaluation, timeout performed, IV checked, risks and benefits discussed and monitors and equipment checked Spinal Block Patient position: sitting Prep: site prepped and draped and DuraPrep Patient monitoring: blood pressure, continuous pulse ox and heart rate Approach: midline Location: L3-4 Injection technique: single-shot Needle Needle type: Pencan  Needle gauge: 24 G Needle length: 10 cm Assessment Sensory level: T4 Additional Notes Clear free flow CSF on first attempt.  No paresthesia.  Patient tolerated procedure well with no apparent complications.  Charlton Haws, MD

## 2013-11-28 NOTE — Transfer of Care (Signed)
Immediate Anesthesia Transfer of Care Note  Patient: [name] [name]  Procedure(s) Performed: Procedure(s): CESAREAN SECTION, hysterotomy and circlage removal (N/A)  Patient Location: PACU  Anesthesia Type:Spinal  Level of Consciousness: awake, alert  and oriented  Airway & Oxygen Therapy: Patient Spontanous Breathing  Post-op Assessment: Report given to PACU RN and Post -op Vital signs reviewed and stable  Post vital signs: Reviewed and stable  Complications: No apparent anesthesia complications

## 2013-11-28 NOTE — Op Note (Signed)
[name], [name]           ACCOUNT NO.:  [id number]  MEDICAL RECORD NO.:  [id number]  LOCATION:  [facility]                          FACILITY:  [facility]  PHYSICIAN:  Shakema Surita L. Taos Tapp, M.D.DATE OF BIRTH:  25-Jan-1981  DATE OF PROCEDURE:  11/28/2013 DATE OF DISCHARGE:                              OPERATIVE REPORT   PREOPERATIVE DIAGNOSES:  Intrauterine pregnancy at 43 and 3, preterm premature rupture of membranes, and cervical incompetence.  POSTOPERATIVE DIAGNOSES:  Intrauterine pregnancy at 34 and 3, preterm premature rupture of membranes, and cervical incompetence.  PROCEDURE:  Hysterotomy with removal of previable fetus and cerclage removal.  SURGEON:  Antanette Richwine L. Helane Rima, M.D.  ANESTHESIA:  Spinal.  EBL:  Less than 500 mL.  COMPLICATIONS:  None.  DESCRIPTION OF PROCEDURE:  Patient was taken to the operating room.  Her spinal was placed.  She was then prepped and draped in the usual sterile fashion.  A Foley catheter was inserted.  Time-out was performed.  A low transverse incision was made, carried down to the fascia.  Fascia scored in the midline and extended laterally.  Rectus muscles were separated in the midline.  The peritoneum was entered bluntly.  The peritoneal incision was then stretched.  The bladder blade was then inserted.  The lower uterine segment was identified.  I could see the cerclage knot right at the bladder flap.  I grasped the knot, cut the knot, and removed the cerclage.  I then made a small low transverse incision, but I did have to T it up because the fetal vertex was trapped in the fundal region.  Removed the baby was a previable female with fused eyelids. The cord was clamped and cut.  The placenta did appear to be very inflamed and it is possible there was some developing chorioamnionitis and this may explain the patient's sudden onset of labor.  The placenta was manually removed.  It was sent to pathology.  The uterus was exteriorized.  I then  had to close the T in 2 layers using 0 chromic, and then closed the low transverse incision again in 2 layers using 0 chromic.  There was some oozing which I then had to use a series of figure-of-eight for hemostasis, and then I replaced the uterus and observed, there was no bleeding noted.  I did place some Surgicel across the incision because she had been oozy across the peritoneum.  Closed the fascia using 0 Vicryl.  We closed the subcu using plain gut interrupted and closed the skin using 4-0 Vicryl on a Keith needle.  A honeycomb dressing was applied.  All sponge, lap, and instrument counts were correct x2.  Patient went to recovery room in stable condition.     Prima Rayner L. Helane Rima, M.D.     Nevin Bloodgood  D:  11/28/2013  T:  11/28/2013  Job:  [id number]

## 2013-11-28 NOTE — Brief Op Note (Signed)
11/21/2013 - 11/28/2013  5:25 AM  PATIENT:  [name] [name]  33 y.o. female  PRE-OPERATIVE DIAGNOSIS:  cervical incompetance, PPRom, IUP at 22 w 3 days  POST-OPERATIVE DIAGNOSIS:  Same  PROCEDURE:  Hysterotomy and cerclage removal  SURGEON:  Surgeon(s) and Role:    * Cyril Mourning, MD - Primary  PHYSICIAN ASSISTANT:   ASSISTANTS: none   ANESTHESIA:   spinal  EBL:  Total I/O In: 2000 [I.V.:2000] Out: -   BLOOD ADMINISTERED:none  DRAINS: Urinary Catheter (Foley)   LOCAL MEDICATIONS USED:  NONE  SPECIMEN:  Source of Specimen:  placemta  DISPOSITION OF SPECIMEN:  PATHOLOGY  COUNTS:  YES  TOURNIQUET:  * No tourniquets in log *  DICTATION: .Other Dictation: Dictation Number [id number]  PLAN OF CARE: Admit to inpatient   PATIENT DISPOSITION:  PACU - hemodynamically stable.   Delay start of Pharmacological VTE agent (>24hrs) due to surgical blood loss or risk of bleeding: not applicable

## 2013-11-28 NOTE — Anesthesia Preprocedure Evaluation (Signed)
Anesthesia Evaluation  Patient identified by MRN, date of birth, ID band Patient awake    Reviewed: Allergy & Precautions, H&P , NPO status , Patient's Chart, lab work & pertinent test results, reviewed documented beta blocker date and time   History of Anesthesia Complications Negative for: history of anesthetic complications  Airway       Dental  (+) Teeth Intact   Pulmonary former smoker,  Chronic bronchitis breath sounds clear to auscultation        Cardiovascular hypertension (no meds), Rhythm:regular Rate:Normal  hyperlipidemia   Neuro/Psych Anxiety Depression negative neurological ROS     GI/Hepatic Neg liver ROS, GERD-  Medicated,Fatty liver   Endo/Other  Morbid obesity  Renal/GU negative Renal ROS     Musculoskeletal   Abdominal   Peds  Hematology negative hematology ROS (+)   Anesthesia Other Findings Ate solid food at midnight  Reproductive/Obstetrics (+) Pregnancy ([redacted]w[redacted]d, abdominal cerclage, PTL - fetal parts in vagina --> urgent C/S)                           Anesthesia Physical Anesthesia Plan  ASA: III and emergent  Anesthesia Plan: Spinal   Post-op Pain Management:    Induction:   Airway Management Planned:   Additional Equipment:   Intra-op Plan:   Post-operative Plan:   Informed Consent: I have reviewed the patients History and Physical, chart, labs and discussed the procedure including the risks, benefits and alternatives for the proposed anesthesia with the patient or authorized representative who has indicated his/her understanding and acceptance.     Plan Discussed with: Surgeon and CRNA  Anesthesia Plan Comments:         Anesthesia Quick Evaluation

## 2013-11-28 NOTE — Anesthesia Postprocedure Evaluation (Signed)
  Anesthesia Post-op Note  Anesthesia Post Note  Patient: [name] [name]  Procedure(s) Performed: Procedure(s) (LRB): CESAREAN SECTION, hysterotomy and circlage removal (N/A)  Anesthesia type: Spinal  Patient location: PACU  Post pain: Pain level controlled  Post assessment: Post-op Vital signs reviewed  Last Vitals:  Filed Vitals:   11/28/13 0714  BP: 105/56  Pulse: 107  Temp: 36.5 C  Resp: 20    Post vital signs: Reviewed  Level of consciousness: awake  Complications: No apparent anesthesia complications

## 2013-11-28 NOTE — Progress Notes (Signed)
Assisted patient with ambulation to bathroom.  Patient tolerated well with minimal discomfort.  Assisted with peri care.  Small amount of bleeding at this time. MD notified of patients urine output, 500cc over 11hrs.  Patient is afebrile and denies any discomfort at this time.  New orders for 500cc bolus LR.  Will leave catheter in overnight per MD order.  Will continue to monitor.  Leighton Roach, RN--------------

## 2013-11-28 NOTE — Progress Notes (Signed)
Patient went to bathroom and felt pressure in vagina Nurse called me  i recommended stat ultrasound Ultrasound present when I arrived to patient's room Ultrasound fetal lower extremities in vagina Vaginal exam by myself I can palpate feet in vagina and cervix does still feel tight Recommend hysterotomy  Patient requests cerclage removal as well Discussed with patient and her family that baby is previable and will not survive Risks or surgery discussed with patient Consent signed

## 2013-11-28 NOTE — Anesthesia Postprocedure Evaluation (Signed)
  Anesthesia Post-op Note  Patient: [name] [name]  Procedure(s) Performed: Procedure(s): CESAREAN SECTION, hysterotomy and circlage removal (N/A)  Patient Location: Women's Unit  Anesthesia Type:Spinal  Level of Consciousness: awake  Airway and Oxygen Therapy: Patient Spontanous Breathing  Post-op Pain: mild  Post-op Assessment: Patient's Cardiovascular Status Stable and Respiratory Function Stable  Post-op Vital Signs: stable  Last Vitals:  Filed Vitals:   11/28/13 1158  BP: 100/62  Pulse: 102  Temp: 36.6 C  Resp: 18    Complications: No apparent anesthesia complications

## 2013-11-29 ENCOUNTER — Encounter (HOSPITAL_COMMUNITY): Payer: Self-pay | Admitting: Obstetrics and Gynecology

## 2013-11-29 LAB — BASIC METABOLIC PANEL
ANION GAP: 9 (ref 5–15)
BUN: 5 mg/dL — ABNORMAL LOW (ref 6–23)
CALCIUM: 8.9 mg/dL (ref 8.4–10.5)
CHLORIDE: 105 meq/L (ref 96–112)
CO2: 27 meq/L (ref 19–32)
Creatinine, Ser: 0.66 mg/dL (ref 0.50–1.10)
GFR calc Af Amer: >90 mL/min (ref >90)
GFR calc non Af Amer: >90 mL/min (ref >90)
GLUCOSE: 92 mg/dL (ref 70–99)
Potassium: 4.3 mEq/L (ref 3.7–5.3)
Sodium: 141 mEq/L (ref 137–147)

## 2013-11-29 LAB — CBC
HEMATOCRIT: 25.8 % — AB (ref 36.0–46.0)
HEMOGLOBIN: 8.6 g/dL — AB (ref 12.0–15.0)
MCH: 29 pg (ref 26.0–34.0)
MCHC: 33.3 g/dL (ref 30.0–36.0)
MCV: 86.9 fL (ref 78.0–100.0)
Platelets: 136 10*3/uL — ABNORMAL LOW (ref 150–400)
RBC: 2.97 MIL/uL — ABNORMAL LOW (ref 3.87–5.11)
RDW: 13.9 % (ref 11.5–15.5)
WBC: 6.4 10*3/uL (ref 4.0–10.5)

## 2013-11-29 NOTE — Plan of Care (Signed)
Problem: Phase II Progression Outcomes Goal: Incision intact & without signs/symptoms of infection Outcome: Not Applicable Date Met:  43/83/81 Drsg old drainage marked

## 2013-11-29 NOTE — Progress Notes (Signed)
Ur chart review completed.  

## 2013-11-29 NOTE — Progress Notes (Signed)
Anupama was surrounded by family (her sister and mother-in-law) and she was holding her baby, [name].  She said that she is taking one moment at a time.  She reported that they had already done prayers and rituals and that she was satisfied with the pictures from [facility] and did not wish to have [place] come and do additional photos.  She was appreciative for [place] checking in on her.  We will continue to follow up as we are able, but please page as needs arise.  [employer] Pager, [phone] 11:47 AM

## 2013-11-29 NOTE — Progress Notes (Signed)
Subjective: Postpartum Day 1: Cesarean Delivery Patient reports incisional pain, tolerating PO, + flatus and no problems voiding.    Objective: Vital signs in last 24 hours: Temp:  [97.8 F (36.6 C)-98.3 F (36.8 C)] 97.9 F (36.6 C) (07/06 0540) Pulse Rate:  [92-107] 92 (07/06 0540) Resp:  [16-18] 16 (07/06 0540) BP: (100-116)/(56-70) 112/65 mmHg (07/06 0540) SpO2:  [98 %-100 %] 98 % (07/06 0540)  Physical Exam:  General: alert and cooperative Lochia: appropriate Uterine Fundus: firm Incision: abd pressure bandage noted with small old drainage noted, honeycomb dressing noted with moderate drainage.no active bleeding DVT Evaluation: No evidence of DVT seen on physical exam. Negative Homan's sign. No cords or calf tenderness. No significant calf/ankle edema.   Recent Labs  11/29/13 0500  HGB 8.6*  HCT 25.8*    Assessment/Plan: Status post Cesarean section. And PROM with fetal demise Change dressing.  [name] [name] 11/29/2013, 8:30 AM

## 2013-11-29 NOTE — Progress Notes (Signed)
I spent some additional time with Francy and with her husband.  They have been through 2 losses together.  After their son Maurene Capes died at [redacted] weeks gestation, they benefited from [place] and support from family and friends.  They did not get much time to hold Maurene Capes because everything happened so fast and was such a shock.  With Kylie's birth, they have been prepared for the possibility of this outcome and they knew they wanted to spend time with her.  They have had the baby with them since delivery.  They are cherishing the time with her and it is appropriate grieving.  Their family is making the arrangements with [facility].  I told them about [employer] program for 1:1 support with a family who has experienced multiple losses as they have.  They also have our information and are aware of our on-going availability.  I provided reflective listening, grief education and grief support as they began to process this loss.  I also gave them a prayer shawl, made by volunteers, to wrap themselves in as a family.  [employer] Pager, [phone] 4:17 PM   11/29/13 1600  Clinical Encounter Type  Visited With Patient and family together  Visit Type Spiritual support  Spiritual Encounters  Spiritual Needs Grief support

## 2013-11-30 LAB — CBC
HCT: 26.2 % — ABNORMAL LOW (ref 36.0–46.0)
Hemoglobin: 8.6 g/dL — ABNORMAL LOW (ref 12.0–15.0)
MCH: 28.6 pg (ref 26.0–34.0)
MCHC: 32.8 g/dL (ref 30.0–36.0)
MCV: 87 fL (ref 78.0–100.0)
Platelets: 144 10*3/uL — ABNORMAL LOW (ref 150–400)
RBC: 3.01 MIL/uL — AB (ref 3.87–5.11)
RDW: 13.7 % (ref 11.5–15.5)
WBC: 5.8 10*3/uL (ref 4.0–10.5)

## 2013-11-30 MED ORDER — FERROUS SULFATE 325 (65 FE) MG PO TABS
325.0000 mg | ORAL_TABLET | Freq: Three times a day (TID) | ORAL | Status: DC
  Administered 2013-12-01: 325 mg via ORAL
  Filled 2013-11-30 (×3): qty 1

## 2013-11-30 NOTE — Progress Notes (Signed)
Subjective: Postpartum Day two: Cesarean Delivery Patient reports tolerating PO, + flatus and no problems voiding.    Objective: Vital signs in last 24 hours: Temp:  [97.5 F (36.4 C)-97.9 F (36.6 C)] 97.6 F (36.4 C) (07/07 0603) Pulse Rate:  [81-106] 81 (07/07 0603) Resp:  [16-18] 16 (07/07 0603) BP: (104-115)/(59-73) 104/61 mmHg (07/07 0603) SpO2:  [97 %-100 %] 100 % (07/07 0603)  Physical Exam:  General: alert Lochia: appropriate Uterine Fundus: firm Incision: healing well DVT Evaluation: No evidence of DVT seen on physical exam.   Recent Labs  11/29/13 0500  HGB 8.6*  HCT 25.8*    Assessment/Plan: Status post Cesarean section. Doing well postoperatively.  Continue current care.  Miraj Truss S 11/30/2013, 7:26 AM

## 2013-11-30 NOTE — Progress Notes (Signed)
11/30/13 1700  Clinical Encounter Type  Visited With Patient and family together (husband Shanon Brow, his parents and grandmother, [name]'s sister)  Visit Type Follow-up;Spiritual support;Social support  Referral From [place] Needs Grief support;Emotional  Stress Factors  Patient Stress Factors Loss  Family Stress Factors Loss   Followed up with [name] [name], and their family to offer further support and bereavement care.  They are processing their grief well, planning a funeral for [name], accepting help and support from family and friends (David's coworker has even mowed the yard), and talking with love and compassion about what they are learning and how they care coping.  Provided pastoral presence; reflective listening; encouragement; and affirmation of their self-care, wisdom, and love.  They are very grateful for yesterday's care from [facility]; will refer her for follow-up support prior to discharge tomorrow, but please also page as needs arise.  Thank you.  [place], [place]

## 2013-12-01 MED ORDER — ALPRAZOLAM 0.5 MG PO TABS: 0.5000 mg | ORAL_TABLET | Freq: Three times a day (TID) | ORAL | Status: DC | PRN

## 2013-12-01 MED ORDER — IBUPROFEN 600 MG PO TABS: 600.0000 mg | ORAL_TABLET | Freq: Four times a day (QID) | ORAL | Status: DC

## 2013-12-01 MED ORDER — OXYCODONE-ACETAMINOPHEN 5-325 MG PO TABS: 1.0000 | ORAL_TABLET | ORAL | Status: DC | PRN

## 2013-12-01 MED ORDER — ZOLPIDEM TARTRATE 5 MG PO TABS: 5.0000 mg | ORAL_TABLET | Freq: Every evening | ORAL | Status: DC | PRN

## 2013-12-01 MED ORDER — FERROUS SULFATE 325 (65 FE) MG PO TABS: 325.0000 mg | ORAL_TABLET | Freq: Three times a day (TID) | ORAL | Status: DC

## 2013-12-01 NOTE — Discharge Summary (Signed)
Obstetric Discharge Summary Reason for Admission: onset of labor and rupture of membranes Prenatal Procedures: ultrasound Intrapartum Procedures: cesarean: low cervical, transverse Postpartum Procedures: none Complications-Operative and Postpartum: none Hemoglobin  Date Value Ref Range Status  11/30/2013 8.6* 12.0 - 15.0 g/dL Final     HCT  Date Value Ref Range Status  11/30/2013 26.2* 36.0 - 46.0 % Final    Physical Exam:  General: alert and cooperative. Mood appropriate. Denies need for anti depressant at this time Lochia: appropriate Uterine Fundus: firm Incision: healing well DVT Evaluation: No evidence of DVT seen on physical exam. Negative Homan's sign. No cords or calf tenderness. Calf/Ankle edema is present.  Discharge Diagnoses: s/p cesarean delivery with non viable fetus  Discharge Information: Date: 12/01/2013 Activity: pelvic rest Diet: routine Medications: PNV, Ibuprofen, Iron, Percocet and Xanax and Ambien Condition: stable Instructions: refer to practice specific booklet Discharge to: home   Newborn Data: Live born female  Birth Weight: 1 lb 0.2 oz (460 g) APGAR: 1, 1  Home with non viable fetus. Service is Sunday.  CURTIS,CAROL G 12/01/2013, 8:34 AM

## 2013-12-01 NOTE — Progress Notes (Signed)
[name] [name] was feeling anxious today as she prepares to go home.  We worked on some relaxation and mindfulness techniques to help her cope with the anxiety.  We reviewed her resources and I affirmed her family and friend support.  Please page if needs arise in this transition time.  [place] Pager, [phone] 11:05 AM   12/01/13 1100  Clinical Encounter Type  Visited With Patient and family together  Visit Type Spiritual support

## 2013-12-01 NOTE — Progress Notes (Signed)
Subjective: Postpartum Day 3: Cesarean Delivery Patient reports incisional pain, tolerating PO, + BM and no problems voiding.    Objective: Vital signs in last 24 hours: Temp:  [97.5 F (36.4 C)-98.4 F (36.9 C)] 98.4 F (36.9 C) (07/08 0549) Pulse Rate:  [76-100] 89 (07/08 0549) Resp:  [18] 18 (07/08 0549) BP: (94-115)/(52-78) 115/78 mmHg (07/08 0549) SpO2:  [99 %-100 %] 99 % (07/08 0549)  Physical Exam:  General: alert and cooperative Lochia: appropriate Uterine Fundus: firm Incision: healing well DVT Evaluation: No evidence of DVT seen on physical exam. Negative Homan's sign. No cords or calf tenderness. Calf/Ankle edema is present.   Recent Labs  11/29/13 0500 11/30/13 0815  HGB 8.6* 8.6*  HCT 25.8* 26.2*    Assessment/Plan: Status post Cesarean section. Postoperative course complicated by PROM and delivery on non viable fetus  Discharge home with standard precautions and return to clinic in 1 week.  CURTIS,CAROL G 12/01/2013, 8:28 AM

## 2014-02-14 ENCOUNTER — Ambulatory Visit (INDEPENDENT_AMBULATORY_CARE_PROVIDER_SITE_OTHER): Payer: 59

## 2014-02-14 ENCOUNTER — Ambulatory Visit (INDEPENDENT_AMBULATORY_CARE_PROVIDER_SITE_OTHER): Payer: 59 | Admitting: Internal Medicine

## 2014-02-14 VITALS — BP 132/86 | HR 114 | Temp 97.4°F | Resp 20 | Ht 64.0 in | Wt 249.0 lb

## 2014-02-14 DIAGNOSIS — E669 Obesity, unspecified: Secondary | ICD-10-CM

## 2014-02-14 DIAGNOSIS — R0602 Shortness of breath: Secondary | ICD-10-CM

## 2014-02-14 DIAGNOSIS — R5381 Other malaise: Secondary | ICD-10-CM

## 2014-02-14 DIAGNOSIS — R05 Cough: Secondary | ICD-10-CM

## 2014-02-14 DIAGNOSIS — R5383 Other fatigue: Secondary | ICD-10-CM

## 2014-02-14 DIAGNOSIS — D649 Anemia, unspecified: Secondary | ICD-10-CM

## 2014-02-14 DIAGNOSIS — J3489 Other specified disorders of nose and nasal sinuses: Secondary | ICD-10-CM

## 2014-02-14 DIAGNOSIS — R059 Cough, unspecified: Secondary | ICD-10-CM

## 2014-02-14 LAB — POCT CBC
Granulocyte percent: 74.2 %G (ref 37–80)
HEMATOCRIT: 38.4 % (ref 37.7–47.9)
HEMOGLOBIN: 12.1 g/dL — AB (ref 12.2–16.2)
Lymph, poc: 1.2 (ref 0.6–3.4)
MCH, POC: 26 pg — AB (ref 27–31.2)
MCHC: 31.6 g/dL — AB (ref 31.8–35.4)
MCV: 82.2 fL (ref 80–97)
MID (cbc): 0.1 (ref 0–0.9)
MPV: 8.4 fL (ref 0–99.8)
POC GRANULOCYTE: 3.8 (ref 2–6.9)
POC LYMPH PERCENT: 23.2 %L (ref 10–50)
POC MID %: 2.6 % (ref 0–12)
Platelet Count, POC: 202 10*3/uL (ref 142–424)
RBC: 4.68 M/uL (ref 4.04–5.48)
RDW, POC: 15.5 %
WBC: 5.1 10*3/uL (ref 4.6–10.2)

## 2014-02-14 LAB — POCT SEDIMENTATION RATE: POCT SED RATE: 35 mm/h — AB (ref 0–22)

## 2014-02-14 MED ORDER — ALBUTEROL SULFATE HFA 108 (90 BASE) MCG/ACT IN AERS: 2.0000 | INHALATION_SPRAY | Freq: Four times a day (QID) | RESPIRATORY_TRACT | Status: DC | PRN

## 2014-02-14 MED ORDER — HYDROCODONE-HOMATROPINE 5-1.5 MG/5ML PO SYRP: 5.0000 mL | ORAL_SOLUTION | Freq: Four times a day (QID) | ORAL | Status: DC | PRN

## 2014-02-14 MED ORDER — CETIRIZINE HCL 10 MG PO TABS: 10.0000 mg | ORAL_TABLET | Freq: Every day | ORAL | Status: DC
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
## 2014-02-15 LAB — COMPREHENSIVE METABOLIC PANEL
ALBUMIN: 4.3 g/dL (ref 3.5–5.2)
ALT: 45 U/L — ABNORMAL HIGH (ref 0–35)
AST: 30 U/L (ref 0–37)
Alkaline Phosphatase: 106 U/L (ref 39–117)
BUN: 11 mg/dL (ref 6–23)
CHLORIDE: 106 meq/L (ref 96–112)
CO2: 26 mEq/L (ref 19–32)
Calcium: 9.5 mg/dL (ref 8.4–10.5)
Creat: 0.93 mg/dL (ref 0.50–1.10)
Glucose, Bld: 104 mg/dL — ABNORMAL HIGH (ref 70–99)
Potassium: 4 mEq/L (ref 3.5–5.3)
Sodium: 140 mEq/L (ref 135–145)
TOTAL PROTEIN: 7.2 g/dL (ref 6.0–8.3)
Total Bilirubin: 0.7 mg/dL (ref 0.2–1.2)

## 2014-02-15 LAB — TSH: TSH: 2.596 u[IU]/mL (ref 0.350–4.500)

## 2014-02-18 ENCOUNTER — Encounter: Payer: Self-pay | Admitting: Internal Medicine

## 2014-02-25 ENCOUNTER — Ambulatory Visit: Payer: Self-pay | Admitting: Family Medicine

## 2015-02-23 ENCOUNTER — Ambulatory Visit: Payer: Self-pay | Admitting: Family Medicine

## 2015-03-05 ENCOUNTER — Encounter (HOSPITAL_COMMUNITY): Payer: Self-pay | Admitting: Emergency Medicine

## 2015-03-05 ENCOUNTER — Emergency Department (HOSPITAL_COMMUNITY): Payer: No Typology Code available for payment source

## 2015-03-05 ENCOUNTER — Emergency Department (HOSPITAL_COMMUNITY)
Admission: EM | Admit: 2015-03-05 | Discharge: 2015-03-05 | Disposition: A | Discharge status: Home / Self Care | Payer: No Typology Code available for payment source | Attending: Emergency Medicine | Admitting: Emergency Medicine

## 2015-03-05 DIAGNOSIS — Y92410 Unspecified street and highway as the place of occurrence of the external cause: Secondary | ICD-10-CM | POA: Diagnosis not present

## 2015-03-05 DIAGNOSIS — Z8719 Personal history of other diseases of the digestive system: Secondary | ICD-10-CM | POA: Insufficient documentation

## 2015-03-05 DIAGNOSIS — S161XXA Strain of muscle, fascia and tendon at neck level, initial encounter: Secondary | ICD-10-CM | POA: Diagnosis not present

## 2015-03-05 DIAGNOSIS — S8991XA Unspecified injury of right lower leg, initial encounter: Secondary | ICD-10-CM | POA: Diagnosis not present

## 2015-03-05 DIAGNOSIS — Z880 Allergy status to penicillin: Secondary | ICD-10-CM | POA: Diagnosis not present

## 2015-03-05 DIAGNOSIS — S0990XA Unspecified injury of head, initial encounter: Secondary | ICD-10-CM | POA: Diagnosis not present

## 2015-03-05 DIAGNOSIS — E669 Obesity, unspecified: Secondary | ICD-10-CM | POA: Insufficient documentation

## 2015-03-05 DIAGNOSIS — F329 Major depressive disorder, single episode, unspecified: Secondary | ICD-10-CM | POA: Diagnosis not present

## 2015-03-05 DIAGNOSIS — Y9389 Activity, other specified: Secondary | ICD-10-CM | POA: Insufficient documentation

## 2015-03-05 DIAGNOSIS — Z86018 Personal history of other benign neoplasm: Secondary | ICD-10-CM | POA: Insufficient documentation

## 2015-03-05 DIAGNOSIS — I10 Essential (primary) hypertension: Secondary | ICD-10-CM | POA: Insufficient documentation

## 2015-03-05 DIAGNOSIS — Y998 Other external cause status: Secondary | ICD-10-CM | POA: Insufficient documentation

## 2015-03-05 DIAGNOSIS — F419 Anxiety disorder, unspecified: Secondary | ICD-10-CM | POA: Insufficient documentation

## 2015-03-05 DIAGNOSIS — S6991XA Unspecified injury of right wrist, hand and finger(s), initial encounter: Secondary | ICD-10-CM | POA: Insufficient documentation

## 2015-03-05 DIAGNOSIS — S39012A Strain of muscle, fascia and tendon of lower back, initial encounter: Secondary | ICD-10-CM | POA: Insufficient documentation

## 2015-03-05 DIAGNOSIS — Z87891 Personal history of nicotine dependence: Secondary | ICD-10-CM | POA: Insufficient documentation

## 2015-03-05 DIAGNOSIS — Z79899 Other long term (current) drug therapy: Secondary | ICD-10-CM | POA: Insufficient documentation

## 2015-03-05 DIAGNOSIS — S199XXA Unspecified injury of neck, initial encounter: Secondary | ICD-10-CM | POA: Diagnosis present

## 2015-03-05 MED ORDER — OXYCODONE-ACETAMINOPHEN 5-325 MG PO TABS: 1.0000 | ORAL_TABLET | ORAL | Status: DC | PRN

## 2015-03-05 MED ORDER — ONDANSETRON HCL 4 MG/2ML IJ SOLN
4.0000 mg | Freq: Once | INTRAMUSCULAR | Status: DC
Start: 2015-03-05 — End: 2015-03-05

## 2015-03-05 MED ORDER — ACETAMINOPHEN 325 MG PO TABS
650.0000 mg | ORAL_TABLET | Freq: Once | ORAL | Status: AC
  Administered 2015-03-05: 650 mg via ORAL
  Filled 2015-03-05: qty 2

## 2015-03-05 MED ORDER — CYCLOBENZAPRINE HCL 10 MG PO TABS
10.0000 mg | ORAL_TABLET | Freq: Once | ORAL | Status: DC
  Filled 2015-03-05: qty 1

## 2015-03-05 MED ORDER — MORPHINE SULFATE (PF) 4 MG/ML IV SOLN: 4.0000 mg | Freq: Once | INTRAVENOUS | Status: DC

## 2015-03-05 MED ORDER — CYCLOBENZAPRINE HCL 10 MG PO TABS: 10.0000 mg | ORAL_TABLET | Freq: Two times a day (BID) | ORAL | Status: DC | PRN

## 2015-03-05 MED ORDER — OXYCODONE-ACETAMINOPHEN 5-325 MG PO TABS
1.0000 | ORAL_TABLET | Freq: Once | ORAL | Status: AC
  Administered 2015-03-05: 1 via ORAL
  Filled 2015-03-05: qty 1
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
## 2015-03-05 NOTE — ED Notes (Signed)
PA at bedside.

## 2015-03-05 NOTE — Discharge Instructions (Signed)
Take Percocet as needed for pain. Take flexeril as needed for muscle spasm. You may take these medications together. Refer to attached documents for more information.
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
## 2015-03-05 NOTE — ED Notes (Signed)
Patient left at this time with all belongings. 

## 2015-03-31 DIAGNOSIS — E559 Vitamin D deficiency, unspecified: Secondary | ICD-10-CM | POA: Insufficient documentation

## 2016-04-20 ENCOUNTER — Ambulatory Visit (INDEPENDENT_AMBULATORY_CARE_PROVIDER_SITE_OTHER): Payer: 59 | Admitting: Family Medicine

## 2016-04-20 VITALS — BP 126/72 | HR 84 | Temp 97.8°F | Resp 18 | Ht 64.0 in | Wt 252.0 lb

## 2016-04-20 DIAGNOSIS — J209 Acute bronchitis, unspecified: Secondary | ICD-10-CM | POA: Diagnosis not present

## 2016-04-20 MED ORDER — HYDROCOD POLST-CPM POLST ER 10-8 MG/5ML PO SUER: 5.0000 mL | Freq: Two times a day (BID) | ORAL | 0 refills | Status: DC | PRN

## 2016-04-20 MED ORDER — AZITHROMYCIN 250 MG PO TABS: ORAL_TABLET | ORAL | 0 refills | Status: DC

## 2016-04-20 NOTE — Progress Notes (Signed)
Patient ID: [name] [name], female    DOB: 01-25-1981, 35 y.o.   MRN: [id number]  PCP: Aura Dials, PA-C  Chief Complaint  Patient presents with  . Bronchitis  . Nasal Congestion    Subjective:   HPI 35 year old female presents for evaluation of cough and nasal drainage x 7 days. Patient of familiar to [facility]. Reports illness began as a sore throat, and developed into coughing which produces dizziness, worsening sore throat, chest tightness with heaviness, and episodes of wheezing with SOB. Taken Tylenol and Cold along with Delsym with minimal relief of symptoms.  Social History   Social History  . Marital status: Married    Spouse name: N/A  . Number of children: N/A  . Years of education: N/A   Occupational History  . Schedule coordinator [place]   Social History Main Topics  . Smoking status: Former Research scientist (life sciences)  . Smokeless tobacco: Never Used     Comment: quit  2003  . Alcohol use No  . Drug use: No  . Sexual activity: Not on file   Other Topics Concern  . Not on file   Social History Narrative  . No narrative on file    Family History  Problem Relation Age of Onset  . Hypertension Father   . Kidney disease Father   . Heart disease Father   . Liver disease Mother      Review of Systems See HPI  Patient Active Problem List   Diagnosis Date Noted  . Cervical incompetence 11/28/2013  . Preterm premature rupture of membranes (PPROM) with unknown onset of labor 11/22/2013  . HYPERLIPIDEMIA 08/10/2008  . HYPOKALEMIA 08/10/2008  . OBESITY 08/10/2008  . FATTY LIVER DISEASE 08/10/2008  . CONSTIPATION 07/29/2008  . RECTAL BLEEDING 07/29/2008  . ABDOMINAL PAIN-MULTIPLE SITES 07/29/2008  . ABNORMAL TRANSAMINASE-LFT'S 07/29/2008  . ANXIETY 07/14/2008  . GERD 07/14/2008  . BLOOD IN STOOL 07/14/2008  . FATIGUE 07/14/2008  . PALPITATIONS 07/14/2008     Prior to Admission medications   Medication Sig Start Date End Date Taking?  Authorizing Provider  albuterol (PROVENTIL HFA;VENTOLIN HFA) 108 (90 BASE) MCG/ACT inhaler Inhale 2 puffs into the lungs every 6 (six) hours as needed for wheezing or shortness of breath. 02/14/14  Yes Leandrew Koyanagi, MD  ALPRAZolam Duanne Moron) 0.5 MG tablet Take 1 tablet (0.5 mg total) by mouth 3 (three) times daily as needed for anxiety. 12/01/13  Yes Juanda Chance, NP  cetirizine (ZYRTEC) 10 MG tablet Take 1 tablet (10 mg total) by mouth daily. Patient taking differently: Take 10 mg by mouth daily as needed for allergies.  02/14/14  Yes Leandrew Koyanagi, MD  HYDROcodone-homatropine [facility]) 5-1.5 MG/5ML syrup Take 5 mLs by mouth every 6 (six) hours as needed. 02/14/14  Yes Leandrew Koyanagi, MD  ibuprofen (ADVIL,MOTRIN) 600 MG tablet Take 1 tablet (600 mg total) by mouth every 6 (six) hours. 12/01/13  Yes Juanda Chance, NP  oxyCODONE-acetaminophen (PERCOCET/ROXICET) 5-325 MG tablet Take 1-2 tablets by mouth every 4 (four) hours as needed for severe pain. 03/05/15  Yes Kaitlyn Szekalski, PA-C  cyclobenzaprine (FLEXERIL) 10 MG tablet Take 1 tablet (10 mg total) by mouth 2 (two) times daily as needed for muscle spasms. Patient not taking: Reported on 04/20/2016 03/05/15   Alvina Chou, PA-C  zolpidem (AMBIEN) 5 MG tablet Take 1 tablet (5 mg total) by mouth at bedtime as needed for sleep (insomnia.). Patient not taking: Reported on 04/20/2016 12/01/13   Juanda Chance, NP  Allergies  Allergen Reactions  . Clindamycin/Lincomycin Hives  . Penicillins Hives     Objective:  Physical Exam  Constitutional: She is oriented to person, place, and time. She appears well-developed and well-nourished.  HENT:  Head: Normocephalic and atraumatic.  Right Ear: Tympanic membrane and external ear normal.  Left Ear: Tympanic membrane and external ear normal.  Nose: Mucosal edema and rhinorrhea present.  Mouth/Throat: No oropharyngeal exudate, posterior oropharyngeal edema or posterior oropharyngeal erythema.    Neck: Normal range of motion. Neck supple.  Cardiovascular: Normal rate, regular rhythm, normal heart sounds and intact distal pulses.   Pulmonary/Chest: Effort normal and breath sounds normal.  Musculoskeletal: Normal range of motion.  Lymphadenopathy:    She has no cervical adenopathy.  Neurological: She is alert and oriented to person, place, and time.  Skin: Skin is warm and dry.  Psychiatric: She has a normal mood and affect. Her behavior is normal. Judgment and thought content normal.     Vitals:   04/20/16 1042  BP: 126/72  Pulse: 84  Resp: 18  Temp: 97.8 F (36.6 C)   Assessment & Plan:  1. Acute bronchitis, unspecified organism Plan: . azithromycin (ZITHROMAX) 250 MG tablet    Sig: Take 2 tabs PO x 1 dose, then 1 tab PO QD x 4 days  . chlorpheniramine-HYDROcodone (TUSSIONEX PENNKINETIC ER) 10-8 MG/5ML SUER    Sig: Take 5 mLs by mouth every 12 (twelve) hours as needed for cough.    Return if symptoms due not improve.  Carroll Sage. Kenton Kingfisher, MSN, FNP-C [facility]

## 2016-04-20 NOTE — Patient Instructions (Addendum)
Start Azithromycin Take 2 tabs x 1 dose, then 1 tab every day for x 4 days Take Tussionex 5 ml every 12 hours for cough  IF you received an x-ray today, you will receive an invoice from [facility]. Please contact [facility] at [phone] with questions or concerns regarding your invoice.   IF you received labwork today, you will receive an invoice from [employer]. Please contact [facility] at [phone] with questions or concerns regarding your invoice.   Our billing staff will not be able to assist you with questions regarding bills from these companies.  You will be contacted with the lab results as soon as they are available. The fastest way to get your results is to activate your My Chart account. Instructions are located on the last page of this paperwork. If you have not heard from [place] regarding the results in 2 weeks, please contact this office.     Sinusitis, Adult Sinusitis is soreness and inflammation of your sinuses. Sinuses are hollow spaces in the bones around your face. Your sinuses are located:  Around your eyes.  In the middle of your forehead.  Behind your nose.  In your cheekbones. Your sinuses and nasal passages are lined with a stringy fluid (mucus). Mucus normally drains out of your sinuses. When your nasal tissues become inflamed or swollen, the mucus can become trapped or blocked so air cannot flow through your sinuses. This allows bacteria, viruses, and funguses to grow, which leads to infection. Sinusitis can develop quickly and last for 7?10 days (acute) or for more than 12 weeks (chronic). Sinusitis often develops after a cold. What are the causes? This condition is caused by anything that creates swelling in the sinuses or stops mucus from draining, including:  Allergies.  Asthma.  Bacterial or viral infection.  Abnormally shaped bones between the nasal passages.  Nasal growths that contain mucus (nasal  polyps).  Narrow sinus openings.  Pollutants, such as chemicals or irritants in the air.  A foreign object stuck in the nose.  A fungal infection. This is rare. What increases the risk? The following factors may make you more likely to develop this condition:  Having allergies or asthma.  Having had a recent cold or respiratory tract infection.  Having structural deformities or blockages in your nose or sinuses.  Having a weak immune system.  Doing a lot of swimming or diving.  Overusing nasal sprays.  Smoking. What are the signs or symptoms? The main symptoms of this condition are pain and a feeling of pressure around the affected sinuses. Other symptoms include:  Upper toothache.  Earache.  Headache.  Bad breath.  Decreased sense of smell and taste.  A cough that may get worse at night.  Fatigue.  Fever.  Thick drainage from your nose. The drainage is often green and it may contain pus (purulent).  Stuffy nose or congestion.  Postnasal drip. This is when extra mucus collects in the throat or back of the nose.  Swelling and warmth over the affected sinuses.  Sore throat.  Sensitivity to light. How is this diagnosed? This condition is diagnosed based on symptoms, a medical history, and a physical exam. To find out if your condition is acute or chronic, your health care provider may:  Look in your nose for signs of nasal polyps.  Tap over the affected sinus to check for signs of infection.  View the inside of your sinuses using an imaging device that has a light  attached (endoscope). If your health care provider suspects that you have chronic sinusitis, you may also:  Be tested for allergies.  Have a sample of mucus taken from your nose (nasal culture) and checked for bacteria.  Have a mucus sample examined to see if your sinusitis is related to an allergy. If your sinusitis does not respond to treatment and it lasts longer than 8 weeks, you may  have an MRI or CT scan to check your sinuses. These scans also help to determine how severe your infection is. In rare cases, a bone biopsy may be done to rule out more serious types of fungal sinus disease. How is this treated? Treatment for sinusitis depends on the cause and whether your condition is chronic or acute. If a virus is causing your sinusitis, your symptoms will go away on their own within 10 days. You may be given medicines to relieve your symptoms, including:  Topical nasal decongestants. They shrink swollen nasal passages and let mucus drain from your sinuses.  Antihistamines. These drugs block inflammation that is triggered by allergies. This can help to ease swelling in your nose and sinuses.  Topical nasal corticosteroids. These are nasal sprays that ease inflammation and swelling in your nose and sinuses.  Nasal saline washes. These rinses can help to get rid of thick mucus in your nose. If your condition is caused by bacteria, you will be given an antibiotic medicine. If your condition is caused by a fungus, you will be given an antifungal medicine. Surgery may be needed to correct underlying conditions, such as narrow nasal passages. Surgery may also be needed to remove polyps. Follow these instructions at home: Medicines  Take, use, or apply over-the-counter and prescription medicines only as told by your health care provider. These may include nasal sprays.  If you were prescribed an antibiotic medicine, take it as told by your health care provider. Do not stop taking the antibiotic even if you start to feel better. Hydrate and Humidify  Drink enough water to keep your urine clear or pale yellow. Staying hydrated will help to thin your mucus.  Use a cool mist humidifier to keep the humidity level in your home above 50%.  Inhale steam for 10-15 minutes, 3-4 times a day or as told by your health care provider. You can do this in the bathroom while a hot shower is  running.  Limit your exposure to cool or dry air. Rest  Rest as much as possible.  Sleep with your head raised (elevated).  Make sure to get enough sleep each night. General instructions  Apply a warm, moist washcloth to your face 3-4 times a day or as told by your health care provider. This will help with discomfort.  Wash your hands often with soap and water to reduce your exposure to viruses and other germs. If soap and water are not available, use hand sanitizer.  Do not smoke. Avoid being around people who are smoking (secondhand smoke).  Keep all follow-up visits as told by your health care provider. This is important. Contact a health care provider if:  You have a fever.  Your symptoms get worse.  Your symptoms do not improve within 10 days. Get help right away if:  You have a severe headache.  You have persistent vomiting.  You have pain or swelling around your face or eyes.  You have vision problems.  You develop confusion.  Your neck is stiff.  You have trouble breathing. This information is  not intended to replace advice given to you by your health care provider. Make sure you discuss any questions you have with your health care provider. Document Released: 05/13/2005 Document Revised: 01/07/2016 Document Reviewed: 03/08/2015 Elsevier Interactive Patient Education  2017 [employer].

## 2017-04-22 ENCOUNTER — Ambulatory Visit: Payer: Self-pay | Admitting: *Deleted

## 2017-04-22 ENCOUNTER — Telehealth: Payer: Self-pay

## 2017-04-22 NOTE — Telephone Encounter (Signed)
Ok to set up new patient appt

## 2017-04-22 NOTE — Telephone Encounter (Signed)
Pt  Has  No  pcp  Yet   She  Is   Awaiting  A  Call  For  A  New  Patient  Set  Up     She  Was  Advised  To  Proceed  To  An urgent  Care . She  Has  One  In mind. She  Was  Told  To  Go to  [facility]  If  She  Has  Any  dizzyness  Weakness  Or   Severe  Headache .

## 2017-04-22 NOTE — Telephone Encounter (Signed)
Pt  Reports  Over   Last    She  Has  Been   Symptoms   She  Also  Has   Symptoms  Of  Anxiety     She  Is   Speaking  In  Complete   sentances  And  Is  In  No  Acute   Distress   Reason for Disposition . Systolic BP  >= 878 OR Diastolic >= 676  Answer Assessment - Initial Assessment Questions 1. BLOOD PRESSURE: "What is the blood pressure?" "Did you take at least two measurements 5 minutes apart?" unknown      *No Answer*2. ONSET: "When did you take your blood pressure?"     2  Weeks   Ago  171/112     1   Week  Ago  It  Was   145/110    3. HOW: "How did you obtain the blood pressure?" (e.g., visiting nurse, automatic home BP monitor)     2  Weeks  Ago  It  Was   Checked  By  [employer]   And  1  Week  At  Pharmacy   4. HISTORY: "Do you have a history of high blood pressure?"      Yes     5. MEDICATIONS: "Are you taking any medications for blood pressure?" "Have you missed any doses recently?"     No 6. OTHER SYMPTOMS: "Do you have any symptoms?" (e.g., headache, chest pain, blurred vision, difficulty breathing, weakness)     Dizzy   At  Times   occasonal  Headache    7. PREGNANCY: "Is there any chance you are pregnant?" "When was your last menstrual period?"    LMP   Oct  18  Protocols used: HIGH BLOOD PRESSURE-A-AH

## 2017-04-22 NOTE — Telephone Encounter (Signed)
Copied from [place]. Topic: Appointment Scheduling - Scheduling Inquiry for Clinic >> Apr 22, 2017  9:13 AM Ether Griffins B wrote: Reason for CRM: pt wanting to establish care with Dr. Charlett Blake

## 2017-04-22 NOTE — Telephone Encounter (Signed)
Please advise 

## 2017-04-23 NOTE — Telephone Encounter (Signed)
Called and left voicemail for pt to schedule a new pt appt.

## 2017-04-26 ENCOUNTER — Encounter: Payer: Self-pay | Admitting: Physician Assistant

## 2017-04-26 ENCOUNTER — Ambulatory Visit: Payer: 59 | Admitting: Physician Assistant

## 2017-04-26 VITALS — BP 134/88 | HR 89 | Temp 97.5°F | Resp 16 | Ht 64.0 in | Wt 252.0 lb

## 2017-04-26 DIAGNOSIS — R740 Nonspecific elevation of levels of transaminase and lactic acid dehydrogenase [LDH]: Secondary | ICD-10-CM

## 2017-04-26 DIAGNOSIS — Z114 Encounter for screening for human immunodeficiency virus [HIV]: Secondary | ICD-10-CM | POA: Diagnosis not present

## 2017-04-26 DIAGNOSIS — E876 Hypokalemia: Secondary | ICD-10-CM

## 2017-04-26 DIAGNOSIS — J309 Allergic rhinitis, unspecified: Secondary | ICD-10-CM

## 2017-04-26 DIAGNOSIS — E785 Hyperlipidemia, unspecified: Secondary | ICD-10-CM

## 2017-04-26 DIAGNOSIS — Z6841 Body Mass Index (BMI) 40.0 and over, adult: Secondary | ICD-10-CM

## 2017-04-26 DIAGNOSIS — E282 Polycystic ovarian syndrome: Secondary | ICD-10-CM | POA: Insufficient documentation

## 2017-04-26 DIAGNOSIS — F4323 Adjustment disorder with mixed anxiety and depressed mood: Secondary | ICD-10-CM | POA: Diagnosis not present

## 2017-04-26 DIAGNOSIS — R5383 Other fatigue: Secondary | ICD-10-CM

## 2017-04-26 DIAGNOSIS — R7401 Elevation of levels of liver transaminase levels: Secondary | ICD-10-CM

## 2017-04-26 DIAGNOSIS — E559 Vitamin D deficiency, unspecified: Secondary | ICD-10-CM | POA: Diagnosis not present

## 2017-04-26 MED ORDER — FLUTICASONE PROPIONATE 50 MCG/ACT NA SUSP: 2.0000 | Freq: Every day | NASAL | 12 refills | Status: AC

## 2017-04-26 MED ORDER — BUSPIRONE HCL 15 MG PO TABS
15.0000 mg | ORAL_TABLET | Freq: Two times a day (BID) | ORAL | 3 refills | Status: DC
Start: 2017-04-26 — End: 2022-06-13

## 2017-04-26 NOTE — Progress Notes (Signed)
Subjective:    Patient ID: [name] [name], female    DOB: [birth year], 36 y.o.   MRN: [id number]  HPI  Pt presents for evaluation of "knot" in the back of her head, depression and for blood work.  She does not have a PCP. She is looking to establish care with [place].  Patient noticed a knot in the back of her head about 4 days ago when blow drying her hair. She is also sore and tense in that area and on the back of her neck. No recent head injuries. No falls. Patient uses different shampoos everyday but she has been using the same ones for a while. No drainage.   Pt reports that she has been fatigued and wants labs to make sure that her health is okay. She lost both of her parents to cancer and is worried that she might get cancer too. Patient admits to an unhealthy diet, overeating, and not drinking enough water. Patient states that she is usually too tired to cook after work.   Pt reports that she has been overwhelmed since 2011 when she lost her son a few hours after birth. She also lost her daughter in 57 one day after her birth. She lost her dad in 2016 to a stroke, and her mom in 2017 after she had been grieving for a long time. Patient saw a therapist at [place] of this year. Patient takes Xanax as needed and that helps. She has trouble sleeping. She sleeps 4-5 hours per night. No problem falling asleep but has trouble staying asleep due to anxiety.    Depression screen [facility] 2/9 04/26/2017 04/20/2016  Decreased Interest 2 0  Down, Depressed, Hopeless 2 0  PHQ - 2 Score 4 0  Altered sleeping 3 -  Tired, decreased energy 3 -  Change in appetite 3 -  Feeling bad or failure about yourself  1 -  Trouble concentrating 3 -  Moving slowly or fidgety/restless 3 -  Suicidal thoughts 0 -  PHQ-9 Score 20 -  Difficult doing work/chores Very difficult -    Review of Systems  Constitutional: Positive for fatigue. Negative for appetite change, chills, fever and unexpected  weight change.  HENT: Positive for congestion, rhinorrhea and sinus pressure (maxillary). Negative for sore throat.   Eyes:       Watery eyes  Respiratory: Negative for shortness of breath.   Cardiovascular: Negative for chest pain and palpitations.  Gastrointestinal: Positive for nausea. Negative for abdominal pain, constipation, diarrhea and vomiting.  Genitourinary: Negative for dysuria.  Musculoskeletal: Positive for back pain (upper back. Happens daily. 4/10 pain at its worst) and neck pain (for four days. 4/5 pain scale. ).  Neurological: Positive for light-headedness and headaches (around temples. Has been there for a week. 3/10 pain.).  Psychiatric/Behavioral: Negative for suicidal ideas. The patient is nervous/anxious.    Patient Active Problem List   Diagnosis Date Noted  . PCOS (polycystic ovarian syndrome) 04/26/2017  . BMI 40.0-44.9, adult ([place]) 04/26/2017  . Vitamin D deficiency 03/31/2015  . Cervical incompetence 11/28/2013  . Elevated BP 05/24/2013  . Hyperlipidemia 08/10/2008  . HYPOKALEMIA 08/10/2008  . OBESITY 08/10/2008  . FATTY LIVER DISEASE 08/10/2008  . CONSTIPATION 07/29/2008  . RECTAL BLEEDING 07/29/2008  . ABDOMINAL PAIN-MULTIPLE SITES 07/29/2008  . ABNORMAL TRANSAMINASE-LFT'S 07/29/2008  . ANXIETY 07/14/2008  . GERD 07/14/2008  . BLOOD IN STOOL 07/14/2008  . Fatigue 07/14/2008  . PALPITATIONS 07/14/2008   Outpatient Encounter Medications as of 04/26/2017  Medication Sig  . ALPRAZolam (XANAX) 0.5 MG tablet Take 1 tablet (0.5 mg total) by mouth 3 (three) times daily as needed for anxiety.  . busPIRone (BUSPAR) 15 MG tablet Take 1 tablet (15 mg total) by mouth 2 (two) times daily.  . fluticasone (FLONASE) 50 MCG/ACT nasal spray Place 2 sprays into both nostrils daily.  . [DISCONTINUED] albuterol (PROVENTIL HFA;VENTOLIN HFA) 108 (90 BASE) MCG/ACT inhaler Inhale 2 puffs into the lungs every 6 (six) hours as needed for wheezing or shortness of breath.  .  [DISCONTINUED] azithromycin (ZITHROMAX) 250 MG tablet Take 2 tabs PO x 1 dose, then 1 tab PO QD x 4 days  . [DISCONTINUED] cetirizine (ZYRTEC) 10 MG tablet Take 1 tablet (10 mg total) by mouth daily. (Patient taking differently: Take 10 mg by mouth daily as needed for allergies. )  . [DISCONTINUED] chlorpheniramine-HYDROcodone (TUSSIONEX PENNKINETIC ER) 10-8 MG/5ML SUER Take 5 mLs by mouth every 12 (twelve) hours as needed for cough. (Patient not taking: Reported on 04/26/2017)  . [DISCONTINUED] cyclobenzaprine (FLEXERIL) 10 MG tablet Take 1 tablet (10 mg total) by mouth 2 (two) times daily as needed for muscle spasms. (Patient not taking: Reported on 04/20/2016)  . [DISCONTINUED] HYDROcodone-homatropine (HYCODAN) 5-1.5 MG/5ML syrup Take 5 mLs by mouth every 6 (six) hours as needed. (Patient not taking: Reported on 04/26/2017)  . [DISCONTINUED] ibuprofen (ADVIL,MOTRIN) 600 MG tablet Take 1 tablet (600 mg total) by mouth every 6 (six) hours. (Patient not taking: Reported on 04/26/2017)  . [DISCONTINUED] oxyCODONE-acetaminophen (PERCOCET/ROXICET) 5-325 MG tablet Take 1-2 tablets by mouth every 4 (four) hours as needed for severe pain. (Patient not taking: Reported on 04/26/2017)  . [DISCONTINUED] zolpidem (AMBIEN) 5 MG tablet Take 1 tablet (5 mg total) by mouth at bedtime as needed for sleep (insomnia.). (Patient not taking: Reported on 04/20/2016)   No facility-administered encounter medications on file as of 04/26/2017.    Allergies  Allergen Reactions  . Clindamycin/Lincomycin Hives  . Penicillins Hives      Objective:  Vitals:   04/26/17 0821  BP: 134/88  Pulse: 89  Resp: 16  Temp: (!) 97.5 F (36.4 C)  SpO2: 100%     Physical Exam  Constitutional: She appears well-developed and well-nourished.  HENT:  Head: Normocephalic and atraumatic.  Right Ear: External ear normal.  Left Ear: External ear normal.  Mouth/Throat: Oropharynx is clear and moist.  The area patient was concerned about  is prominent soft tissue. No concerns for inflammation or infection  Eyes: Conjunctivae are normal.  Cardiovascular: Normal rate, regular rhythm, normal heart sounds and intact distal pulses.  Pulmonary/Chest: Effort normal and breath sounds normal. No respiratory distress.  Lymphadenopathy:    She has no cervical adenopathy.  Neurological: She is alert.  Skin: Skin is warm.      Assessment & Plan:  1. Fatigue, unspecified type - CBC with Differential/Platelet - Comprehensive metabolic panel - TSH - Urinalysis, dipstick only  2. Hyperlipidemia, unspecified hyperlipidemia type - Comprehensive metabolic panel - Lipid panel  3. HYPOKALEMIA - Comprehensive metabolic panel  4. ABNORMAL TRANSAMINASE-LFT'S - Comprehensive metabolic panel  5. Vitamin D deficiency - VITAMIN D 25 Hydroxy (Vit-D Deficiency, Fractures)  6. Allergic rhinitis, unspecified seasonality, unspecified trigger - fluticasone (FLONASE) 50 MCG/ACT nasal spray; Place 2 sprays into both nostrils daily.  Dispense: 16 g; Refill: 12  7. Nonintractable headache, unspecified chronicity pattern, unspecified headache type -Use over the counter pain medicine as needed   8. Depression, unspecified depression type - busPIRone (BUSPAR) 15  MG tablet; Take 1 tablet (15 mg total) by mouth 2 (two) times daily.  Dispense: 60 tablet; Refill: 3  9. Screening for HIV (human immunodeficiency virus) - HIV antibody  10. PCOS (polycystic ovarian syndrome) -Follow up with gynecologist as needed  11. BMI 40.0-44.9, adult (H[facility]) - Amb Ref to Medical Weight Management  [place], Student-PA

## 2017-04-26 NOTE — Patient Instructions (Addendum)
Buspirone:  The tablets are 15 mg each and are scored to be broken in half (7.5 mg) or in thirds (5 mg). Start by taking 5 mg (1/3 tablet) twice each day x 1 week. Then 7.5 mg (1/2 tablet) twice each day x 1 week. Then 10 mg (2/3 tablet) twice each day x 1 week. Then 12.5 mg (1/2 tablet + 1/3 tablet) twice each day x 1 week. Then 15 mg twice (1 whole tablet) each day x 1 week.     IF you received an x-ray today, you will receive an invoice from [facility]. Please contact [facility] at [phone] with questions or concerns regarding your invoice.   IF you received labwork today, you will receive an invoice from [facility]. Please contact [facility] at [phone] with questions or concerns regarding your invoice.   Our billing staff will not be able to assist you with questions regarding bills from these companies.  You will be contacted with the lab results as soon as they are available. The fastest way to get your results is to activate your My Chart account. Instructions are located on the last page of this paperwork. If you have not heard from [place] regarding the results in 2 weeks, please contact this office.

## 2017-04-26 NOTE — Progress Notes (Signed)
Patient ID: [name] [name], female     DOB: 1981-05-17, 36 y.o.    MRN: [id number]  PCP: No primary care provider on file.  Chief Complaint  Patient presents with  . Cyst    "Knot" per pt Located on back of head  . Labs Only    Lipid, Glucose, other  . Depression    see screening    Subjective:   This patient is new to me and presents for evaluation of several issues. She needs to establish with a PCP.  1. "Knot" on the back of the head/neck. Noticed a lump in the occiput about 4 days ago while drying her hair. Generally sore and "tense" in that area and down the neck. No trauma or injury recalled. No itching, skin lesions or drainage. No new skin/hair products.  2. Labs She would like labs updated because she is feeling tired. Her parents both died of cancer, she isn't eating a healthy diet, doesn't drink much water. She wants to "make sure" that her health is "okay." Chart reviewed. Last labs were associated with pregnancy and preterm labor in 01-Sep-2013. Hyperlipidemia, hypokalemia, elevated LFTs, low vitamin D noted.  3. Depression Significant life stressors. Her first pregnancy, in 9702, was complicated by early labor. Her son died a few hours after birth at 68 months gestation. Her second pregnancy ended the same way, her daughter died the day following delivery, also at 6 months gestation. Her father died following a stroke in 09-02-14. Her mother died "of her grief" in 09/02/15. She saw a therapist with Hospice in the late summer this year. Alprazolam helps her sleep-she awakens early with anxiety.  Review of Systems Constitutional: Positive for fatigue. Negative for appetite change, chills, fever and unexpected weight change.  HENT: Positive for congestion, rhinorrhea and sinus pressure (maxillary). Negative for sore throat.   Eyes:       Watery eyes  Respiratory: Negative for shortness of breath.   Cardiovascular: Negative for chest pain and palpitations.  Gastrointestinal:  Positive for nausea. Negative for abdominal pain, constipation, diarrhea and vomiting.  Genitourinary: Negative for dysuria.  Musculoskeletal: Positive for back pain (upper back. Happens daily. 4/10 pain at its worst) and neck pain (for four days. 4/5 pain scale. ).  Neurological: Positive for light-headedness and headaches (around temples. Has been there for a week. 3/10 pain.).  Psychiatric/Behavioral: Negative for suicidal ideas. The patient is nervous/anxious.       Depression screen [facility] 2/9 04/26/2017 04/20/2016  Decreased Interest 2 0  Down, Depressed, Hopeless 2 0  PHQ - 2 Score 4 0  Altered sleeping 3 -  Tired, decreased energy 3 -  Change in appetite 3 -  Feeling bad or failure about yourself  1 -  Trouble concentrating 3 -  Moving slowly or fidgety/restless 3 -  Suicidal thoughts 0 -  PHQ-9 Score 20 -  Difficult doing work/chores Very difficult -     Prior to Admission medications   Medication Sig Start Date End Date Taking? Authorizing Provider  ALPRAZolam Duanne Moron) 0.5 MG tablet Take 1 tablet (0.5 mg total) by mouth 3 (three) times daily as needed for anxiety. 12/01/13  Yes Juanda Chance, NP     Allergies  Allergen Reactions  . Clindamycin/Lincomycin Hives  . Penicillins Hives     Patient Active Problem List   Diagnosis Date Noted  . Cervical incompetence 11/28/2013  . Preterm premature rupture of membranes (PPROM) with unknown onset of labor 11/22/2013  .  HYPERLIPIDEMIA 08/10/2008  . HYPOKALEMIA 08/10/2008  . OBESITY 08/10/2008  . FATTY LIVER DISEASE 08/10/2008  . CONSTIPATION 07/29/2008  . RECTAL BLEEDING 07/29/2008  . ABDOMINAL PAIN-MULTIPLE SITES 07/29/2008  . ABNORMAL TRANSAMINASE-LFT'S 07/29/2008  . ANXIETY 07/14/2008  . GERD 07/14/2008  . BLOOD IN STOOL 07/14/2008  . FATIGUE 07/14/2008  . PALPITATIONS 07/14/2008     Family History  Problem Relation Age of Onset  . Hypertension Father   . Kidney disease Father   . Heart disease Father   .  Stroke Father   . Liver disease Mother   . Cancer Mother      Social History   Socioeconomic History  . Marital status: Married    Spouse name: Not on file  . Number of children: Not on file  . Years of education: Not on file  . Highest education level: Not on file  Social Needs  . Financial resource strain: Not on file  . Food insecurity - worry: Not on file  . Food insecurity - inability: Not on file  . Transportation needs - medical: Not on file  . Transportation needs - non-medical: Not on file  Occupational History  . Occupation: Engineer, production at [employer]  Tobacco Use  . Smoking status: Former Research scientist (life sciences)  . Smokeless tobacco: Never Used  . Tobacco comment: quit  2003  Substance and Sexual Activity  . Alcohol use: No  . Drug use: No  . Sexual activity: Yes    Birth control/protection: Condom  Other Topics Concern  . Not on file  Social History Narrative   Patient lives at home with her husband.         Objective:  Physical Exam  Constitutional: She is oriented to person, place, and time. She appears well-developed and well-nourished. She is active and cooperative. No distress.  BP 134/88   Pulse 89   Temp (!) 97.5 F (36.4 C)   Resp 16   Ht 5\' 4"  (1.626 m)   Wt 252 lb (114.3 kg)   LMP 03/13/2017   SpO2 100%   BMI 43.26 kg/m   HENT:  Head: Normocephalic and atraumatic.  Right Ear: Hearing normal.  Left Ear: Hearing normal.  Eyes: Conjunctivae are normal. No scleral icterus.  Neck: Normal range of motion. Neck supple. No thyromegaly present.    Cardiovascular: Normal rate, regular rhythm and normal heart sounds.  Pulses:      Radial pulses are 2+ on the right side, and 2+ on the left side.  Pulmonary/Chest: Effort normal and breath sounds normal.  Lymphadenopathy:       Head (right side): No tonsillar, no preauricular, no posterior auricular and no occipital adenopathy present.       Head (left side): No tonsillar, no preauricular, no  posterior auricular and no occipital adenopathy present.    She has no cervical adenopathy.       Right: No supraclavicular adenopathy present.       Left: No supraclavicular adenopathy present.  Neurological: She is alert and oriented to person, place, and time. No sensory deficit.  Skin: Skin is warm, dry and intact. No rash noted. No cyanosis or erythema. Nails show no clubbing.  Psychiatric: Her speech is normal and behavior is normal. Judgment and thought content normal. Her mood appears not anxious. Her affect is labile. Her affect is not angry, not blunt and not inappropriate. Cognition and memory are normal. She does not exhibit a depressed mood.        Assessment &  Plan:   Problem List Items Addressed This Visit    Hyperlipidemia    Await labs. Consider treatment as indicated by results. Healthy lifestyle changes encouraged.      Relevant Orders   Comprehensive metabolic panel (Completed)   Lipid panel (Completed)   HYPOKALEMIA    Likely related to complications of preterm labor. Anticipate resolution.      Relevant Orders   Comprehensive metabolic panel (Completed)   Fatigue - Primary    Suspect this is related to grief/depression and sleep deprivation.      Relevant Orders   CBC with Differential/Platelet (Completed)   Comprehensive metabolic panel (Completed)   TSH (Completed)   Urinalysis, dipstick only (Completed)   ABNORMAL TRANSAMINASE-LFT'S    Suspect this was due to complications of preterm labor and has resolved.      Relevant Orders   Comprehensive metabolic panel (Completed)   Vitamin D deficiency    Await labs. Adjust regimen as indicated by results.      Relevant Orders   VITAMIN D 25 Hydroxy (Vit-D Deficiency, Fractures) (Completed)   BMI 40.0-44.9, adult ([place])    Healthy lifestyle changes. Referral to Medical Weight Management.      Relevant Orders   Amb Ref to Medical Weight Management    Other Visit Diagnoses    Allergic rhinitis,  unspecified seasonality, unspecified trigger       Relevant Medications   fluticasone (FLONASE) 50 MCG/ACT nasal spray   Adjustment reaction with anxiety and depression       Relevant Medications   busPIRone (BUSPAR) 15 MG tablet   Screening for HIV (human immunodeficiency virus)       Relevant Orders   HIV antibody (Completed)       Return in about 4 weeks (around 05/24/2017) for re-evaluation of mood, etc.   Fara Chute, PA-C [facility] [place]

## 2017-04-27 LAB — URINALYSIS, DIPSTICK ONLY
BILIRUBIN UA: NEGATIVE
Glucose, UA: NEGATIVE
KETONES UA: NEGATIVE
Leukocytes, UA: NEGATIVE
NITRITE UA: NEGATIVE
Protein, UA: NEGATIVE
RBC, UA: NEGATIVE
SPEC GRAV UA: 1.024 (ref 1.005–1.030)
UUROB: 0.2 mg/dL (ref 0.2–1.0)
pH, UA: 5.5 (ref 5.0–7.5)

## 2017-04-28 ENCOUNTER — Encounter: Payer: Self-pay | Admitting: Physician Assistant

## 2017-04-28 LAB — LIPID PANEL
Chol/HDL Ratio: 4.2 ratio (ref 0.0–4.4)
Cholesterol, Total: 193 mg/dL (ref 100–199)
HDL: 46 mg/dL (ref >39)
LDL Calculated: 125 mg/dL — ABNORMAL HIGH (ref 0–99)
Triglycerides: 109 mg/dL (ref 0–149)
VLDL Cholesterol Cal: 22 mg/dL (ref 5–40)

## 2017-04-28 LAB — CBC WITH DIFFERENTIAL/PLATELET
BASOS ABS: 0 10*3/uL (ref 0.0–0.2)
Basos: 0 %
EOS (ABSOLUTE): 0.1 10*3/uL (ref 0.0–0.4)
EOS: 2 %
HEMATOCRIT: 40.8 % (ref 34.0–46.6)
Hemoglobin: 13.7 g/dL (ref 11.1–15.9)
Immature Grans (Abs): 0 10*3/uL (ref 0.0–0.1)
Immature Granulocytes: 0 %
LYMPHS ABS: 1.1 10*3/uL (ref 0.7–3.1)
Lymphs: 22 %
MCH: 28.8 pg (ref 26.6–33.0)
MCHC: 33.6 g/dL (ref 31.5–35.7)
MCV: 86 fL (ref 79–97)
Monocytes Absolute: 0.2 10*3/uL (ref 0.1–0.9)
Monocytes: 5 %
NEUTROS PCT: 71 %
Neutrophils Absolute: 3.4 10*3/uL (ref 1.4–7.0)
Platelets: 209 10*3/uL (ref 150–379)
RBC: 4.76 x10E6/uL (ref 3.77–5.28)
RDW: 13.9 % (ref 12.3–15.4)
WBC: 4.8 10*3/uL (ref 3.4–10.8)

## 2017-04-28 LAB — COMPREHENSIVE METABOLIC PANEL
ALK PHOS: 106 IU/L (ref 39–117)
ALT: 59 IU/L — ABNORMAL HIGH (ref 0–32)
AST: 41 IU/L — AB (ref 0–40)
Albumin/Globulin Ratio: 1.9 (ref 1.2–2.2)
Albumin: 4.5 g/dL (ref 3.5–5.5)
BUN/Creatinine Ratio: 11 (ref 9–23)
BUN: 10 mg/dL (ref 6–20)
Bilirubin Total: 0.6 mg/dL (ref 0.0–1.2)
CHLORIDE: 105 mmol/L (ref 96–106)
CO2: 20 mmol/L (ref 20–29)
Calcium: 9.9 mg/dL (ref 8.7–10.2)
Creatinine, Ser: 0.9 mg/dL (ref 0.57–1.00)
GFR calc Af Amer: 94 mL/min/{1.73_m2} (ref >59)
GFR calc non Af Amer: 81 mL/min/{1.73_m2} (ref >59)
GLOBULIN, TOTAL: 2.4 g/dL (ref 1.5–4.5)
Glucose: 95 mg/dL (ref 65–99)
POTASSIUM: 4.5 mmol/L (ref 3.5–5.2)
SODIUM: 140 mmol/L (ref 134–144)
Total Protein: 6.9 g/dL (ref 6.0–8.5)

## 2017-04-28 LAB — TSH: TSH: 3.24 u[IU]/mL (ref 0.450–4.500)

## 2017-04-28 LAB — HIV ANTIBODY (ROUTINE TESTING W REFLEX): HIV SCREEN 4TH GENERATION: NONREACTIVE

## 2017-04-28 LAB — VITAMIN D 25 HYDROXY (VIT D DEFICIENCY, FRACTURES): VIT D 25 HYDROXY: 22.2 ng/mL — AB (ref 30.0–100.0)

## 2017-04-28 NOTE — Assessment & Plan Note (Signed)
Suspect this is related to grief/depression and sleep deprivation.

## 2017-04-28 NOTE — Assessment & Plan Note (Signed)
Suspect this was due to complications of preterm labor and has resolved.

## 2017-04-28 NOTE — Assessment & Plan Note (Signed)
Likely related to complications of preterm labor. Anticipate resolution.

## 2017-04-28 NOTE — Assessment & Plan Note (Signed)
Await labs. Adjust regimen as indicated by results.  

## 2017-04-28 NOTE — Assessment & Plan Note (Signed)
Healthy lifestyle changes. Referral to Medical Weight Management.

## 2017-04-28 NOTE — Assessment & Plan Note (Signed)
Await labs. Consider treatment as indicated by results. Healthy lifestyle changes encouraged.

## 2017-04-29 NOTE — Telephone Encounter (Signed)
Looks like Pt establish w/ [facility]?

## 2017-05-12 NOTE — Addendum Note (Signed)
Addended by: Fara Chute on: 05/12/2017 02:30 PM   Modules accepted: Level of Service

## 2017-05-23 ENCOUNTER — Ambulatory Visit: Payer: 59 | Admitting: Physician Assistant

## 2017-06-14 ENCOUNTER — Ambulatory Visit: Payer: 59 | Admitting: Physician Assistant

## 2017-08-28 ENCOUNTER — Encounter: Payer: Self-pay | Admitting: Physician Assistant

## 2018-07-02 ENCOUNTER — Ambulatory Visit
Admission: EM | Admit: 2018-07-02 | Discharge: 2018-07-02 | Disposition: A | Discharge status: Home / Self Care | Payer: 59 | Attending: Family Medicine | Admitting: Family Medicine

## 2018-07-02 DIAGNOSIS — J208 Acute bronchitis due to other specified organisms: Secondary | ICD-10-CM

## 2018-07-02 LAB — POCT INFLUENZA A/B
INFLUENZA A, POC: NEGATIVE
Influenza B, POC: NEGATIVE

## 2018-07-02 MED ORDER — AZITHROMYCIN 250 MG PO TABS: 250.0000 mg | ORAL_TABLET | Freq: Every day | ORAL | 0 refills | Status: DC

## 2018-07-02 MED ORDER — ALBUTEROL SULFATE HFA 108 (90 BASE) MCG/ACT IN AERS
2.0000 | INHALATION_SPRAY | Freq: Once | RESPIRATORY_TRACT | Status: AC
  Administered 2018-07-02: 2 via RESPIRATORY_TRACT

## 2018-07-02 NOTE — ED Provider Notes (Signed)
[place]   [id number] 07/02/18 Arrival Time: 0160   CC: URI symptoms   SUBJECTIVE: History from: patient.  [name] [name] is a 38 y.o. female who presents with abrupt onset of runny nose, sneezing, fatigue, and cough x 3 days.  Has tried OTC medications without relief.  Symptoms are made worse at night.  Reports previous symptoms in the past and reports frequent diagnosis of bronchitis.  PCP usually treats with azithromycin and albuterol inhaler with relief. Complains of associated wheezing.  Denies fever, chills, sinus pain, rhinorrhea, sore throat, SOB, chest pain, nausea, changes in bowel or bladder habits.    Received flu shot this year: no.  ROS: As per HPI.  Past Medical History:  Diagnosis Date  . Allergy   . Anxiety   . Arthritis   . Chronic bronchitis ([place])   . Depression    as a teenager  . Fatty liver   . Fibroid   . GERD (gastroesophageal reflux disease)   . Hyperlipidemia   . Hypertension    pt not meds at this time  . Obesity   . Preterm premature rupture of membranes (PPROM) with unknown onset of labor 11/22/2013  . Vaginal delivery 12/2013   Past Surgical History:  Procedure Laterality Date  . ABDOMINAL CERCLAGE N/A 09/08/2013   Procedure: LAPAROSCOPIC TRANSABDOMINAL CERCLAGE;  Surgeon: Governor Specking, MD;  Location: [facility];  Service: Gynecology;  Laterality: N/A;  . CESAREAN SECTION N/A 11/28/2013   Procedure: CESAREAN SECTION, hysterotomy and circlage removal;  Surgeon: Cyril Mourning, MD;  Location: [facility];  Service: Obstetrics;  Laterality: N/A;  . CHOLECYSTECTOMY  2008  . LAPAROSCOPY  09/08/2013   Procedure: LAPAROSCOPY OPERATIVE;  Surgeon: Governor Specking, MD;  Location: [facility];  Service: Gynecology;;  . transvaginal cerclage  2011  . WISDOM TOOTH EXTRACTION  2010   Allergies  Allergen Reactions  . Clindamycin/Lincomycin Hives  . Penicillins Hives   No current facility-administered medications on file prior to encounter.     Current Outpatient Medications on File Prior to Encounter  Medication Sig Dispense Refill  . ALPRAZolam (XANAX) 0.5 MG tablet Take 1 tablet (0.5 mg total) by mouth 3 (three) times daily as needed for anxiety. 30 tablet 1  . busPIRone (BUSPAR) 15 MG tablet Take 1 tablet (15 mg total) by mouth 2 (two) times daily. 60 tablet 3  . fluticasone (FLONASE) 50 MCG/ACT nasal spray Place 2 sprays into both nostrils daily. 16 g 12   Social History   Socioeconomic History  . Marital status: Married    Spouse name: Not on file  . Number of children: Not on file  . Years of education: Not on file  . Highest education level: Not on file  Occupational History  . Occupation: Engineer, production at [employer]  . Financial resource strain: Not on file  . Food insecurity:    Worry: Not on file    Inability: Not on file  . Transportation needs:    Medical: Not on file    Non-medical: Not on file  Tobacco Use  . Smoking status: Former Research scientist (life sciences)  . Smokeless tobacco: Never Used  . Tobacco comment: quit  2003  Substance and Sexual Activity  . Alcohol use: No  . Drug use: No  . Sexual activity: Yes    Birth control/protection: Condom  Lifestyle  . Physical activity:    Days per week: Not on file    Minutes per session: Not on file  .  Stress: Not on file  Relationships  . Social connections:    Talks on phone: Not on file    Gets together: Not on file    Attends religious service: Not on file    Active member of club or organization: Not on file    Attends meetings of clubs or organizations: Not on file    Relationship status: Not on file  . Intimate partner violence:    Fear of current or ex partner: Not on file    Emotionally abused: Not on file    Physically abused: Not on file    Forced sexual activity: Not on file  Other Topics Concern  . Not on file  Social History Narrative   Patient lives at home with her husband.   Family History  Problem Relation Age of  Onset  . Hypertension Father   . Kidney disease Father   . Heart disease Father   . Stroke Father   . Liver disease Mother   . Cancer Mother     OBJECTIVE:  Vitals:   07/02/18 1500  BP: 116/76  Pulse: (!) 111  Resp: 18  Temp: 98.5 F (36.9 C)  TempSrc: Oral  SpO2: 99%     General appearance: alert; appears mildly fatigued, but nontoxic; speaking in full sentences and tolerating own secretions HEENT: NCAT; Ears: EACs clear, TMs pearly gray; Eyes: PERRL.  EOM grossly intact. Nose: nares patent without rhinorrhea, Throat: oropharynx clear, tonsils non erythematous or enlarged, uvula midline  Neck: supple without LAD Lungs: unlabored respirations, symmetrical air entry; cough: mild; no respiratory distress; CTAB Heart: regular rate and rhythm.  Radial pulses 2+ symmetrical bilaterally Skin: warm and dry Psychological: alert and cooperative; normal mood and affect  ASSESSMENT & PLAN:  1. Viral bronchitis    Educated patient that bronchitis is most likely due to viral illness and that an antibiotic may not be beneficial.  Offered prednisone, pt declines due to side effects.  Pt aware and would still like z-pak.    Meds ordered this encounter  Medications  . albuterol (PROVENTIL HFA;VENTOLIN HFA) 108 (90 Base) MCG/ACT inhaler 2 puff  . azithromycin (ZITHROMAX) 250 MG tablet    Sig: Take 1 tablet (250 mg total) by mouth daily. Take first 2 tablets together, then 1 every day until finished.    Dispense:  6 tablet    Refill:  0    Order Specific Question:   Supervising Provider    Answer:   Raylene Everts [[id number]]   Flu negative. Albuterol inhaler given office Will treat for possible bacterial bronchitis.   Azithromycin prescribed.  Take as directed and to completion Follow up with PCP for further evaluation and management if symptoms persists.  Return or go to ER if you have any new or worsening symptoms fever, chills, nausea, vomiting, chest pain, cough, shortness of  breath, wheezing, abdominal pain, changes in bowel or bladder habits, etc...  Reviewed expectations re: course of current medical issues. Questions answered. Outlined signs and symptoms indicating need for more acute intervention. Patient verbalized understanding. After Visit Summary given.         Lestine Box, PA-C 07/02/18 [id number]

## 2018-07-02 NOTE — ED Triage Notes (Signed)
Pt c/o cough, head/chest congestion x2 days, hx of bronchitis

## 2018-07-02 NOTE — Discharge Instructions (Signed)
Flu negative. Albuterol inhaler given office Will treat for possible bacterial bronchitis.   Azithromycin prescribed.  Take as directed and to completion Follow up with PCP for further evaluation and management if symptoms persists.  Return or go to ER if you have any new or worsening symptoms fever, chills, nausea, vomiting, chest pain, cough, shortness of breath, wheezing, abdominal pain, changes in bowel or bladder habits, etc..Marland Kitchen

## 2020-06-05 ENCOUNTER — Ambulatory Visit
Admission: RE | Admit: 2020-06-05 | Discharge: 2020-06-05 | Disposition: A | Discharge status: Home / Self Care | Payer: 59 | Source: Ambulatory Visit | Attending: Family | Admitting: Family

## 2020-06-05 ENCOUNTER — Other Ambulatory Visit: Payer: Self-pay | Admitting: Family

## 2020-06-05 DIAGNOSIS — R0602 Shortness of breath: Secondary | ICD-10-CM

## 2021-07-23 ENCOUNTER — Encounter: Payer: Self-pay | Admitting: Internal Medicine

## 2021-07-31 ENCOUNTER — Encounter (INDEPENDENT_AMBULATORY_CARE_PROVIDER_SITE_OTHER): Payer: Self-pay

## 2021-08-28 IMAGING — CR DG CHEST 2V
2 series · 2 of 2 positions shown · non-contrast
Comparison: February 14, 2014

CLINICAL DATA: Chest pain and shortness of breath

EXAM:
CHEST - 2 VIEW

[w chest pa]
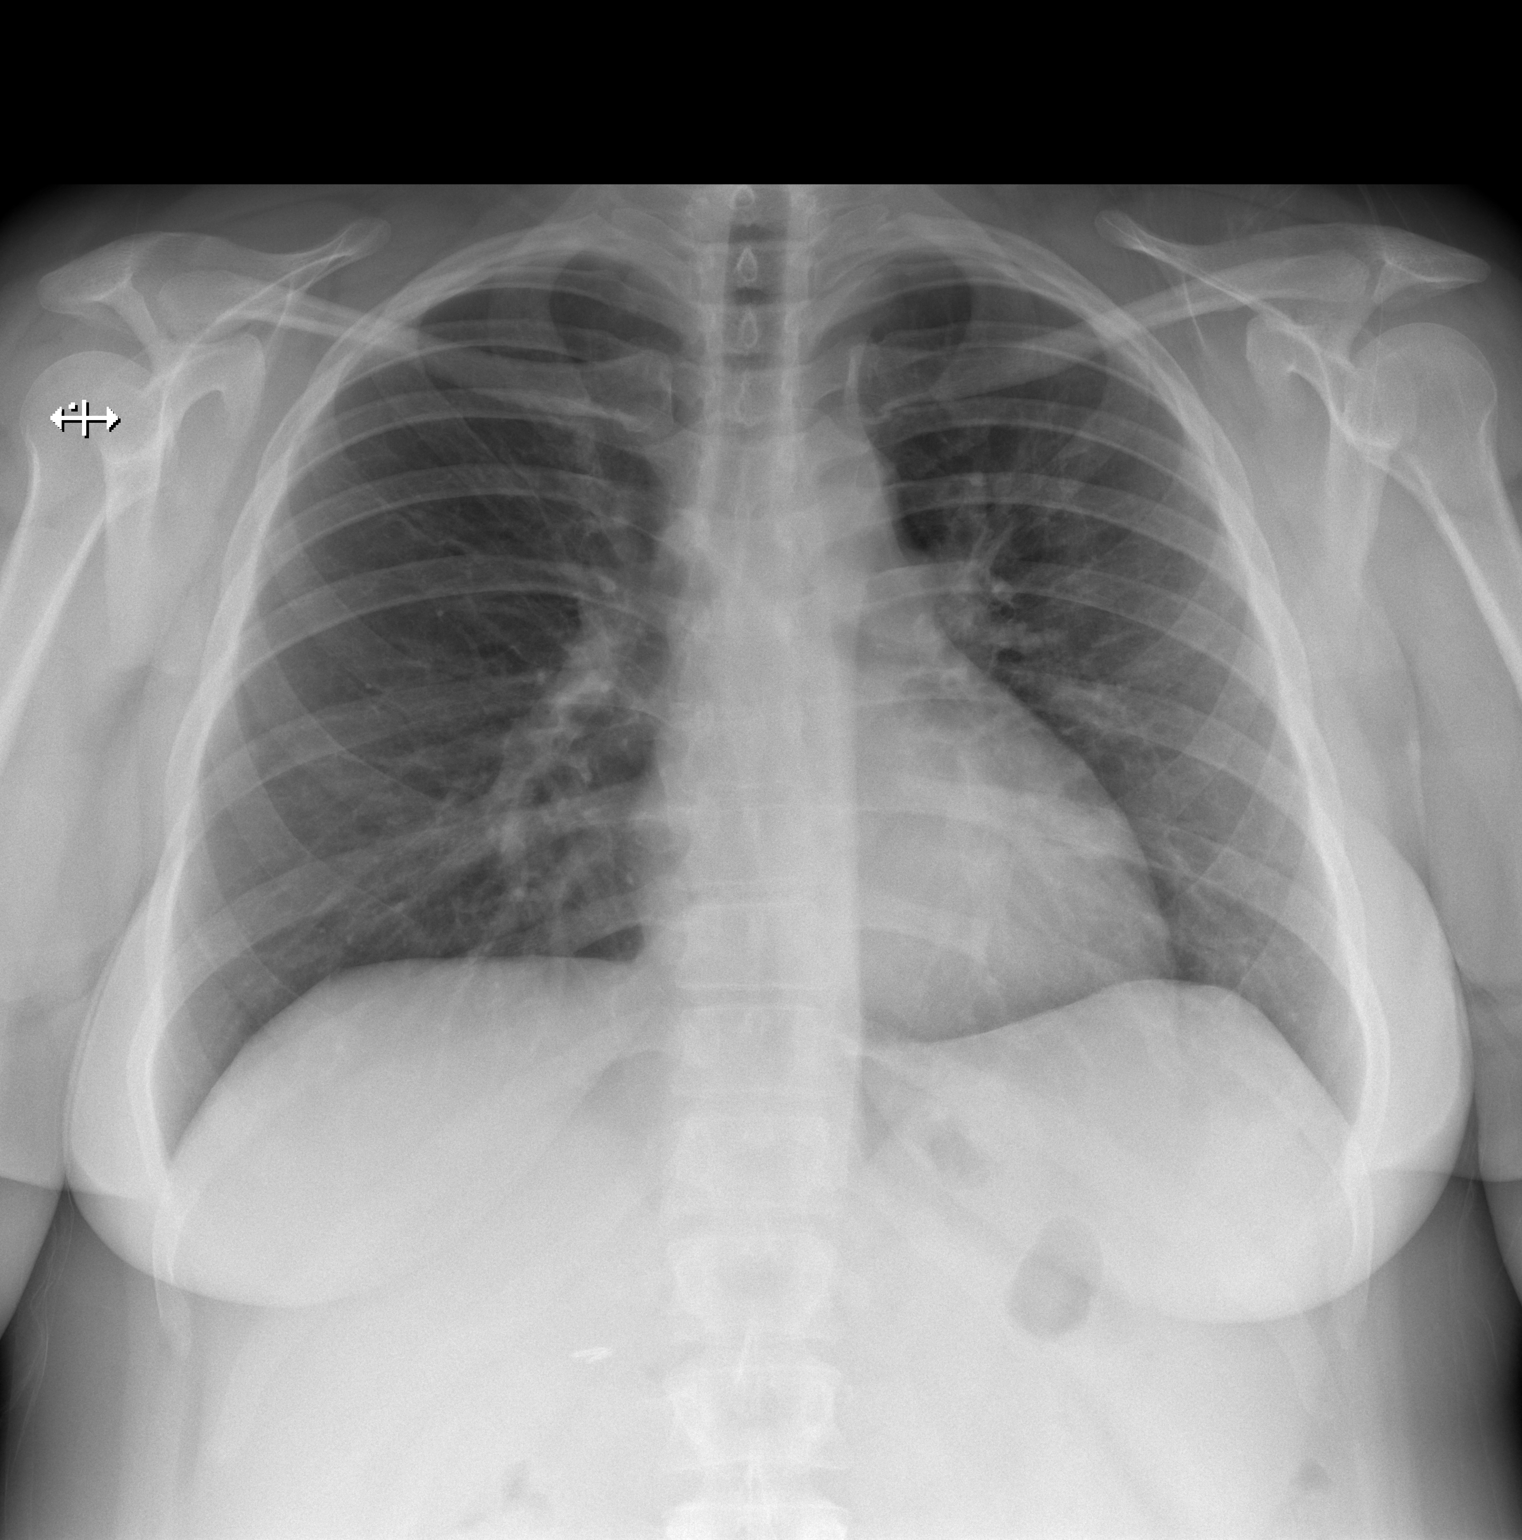

[w chest lat]
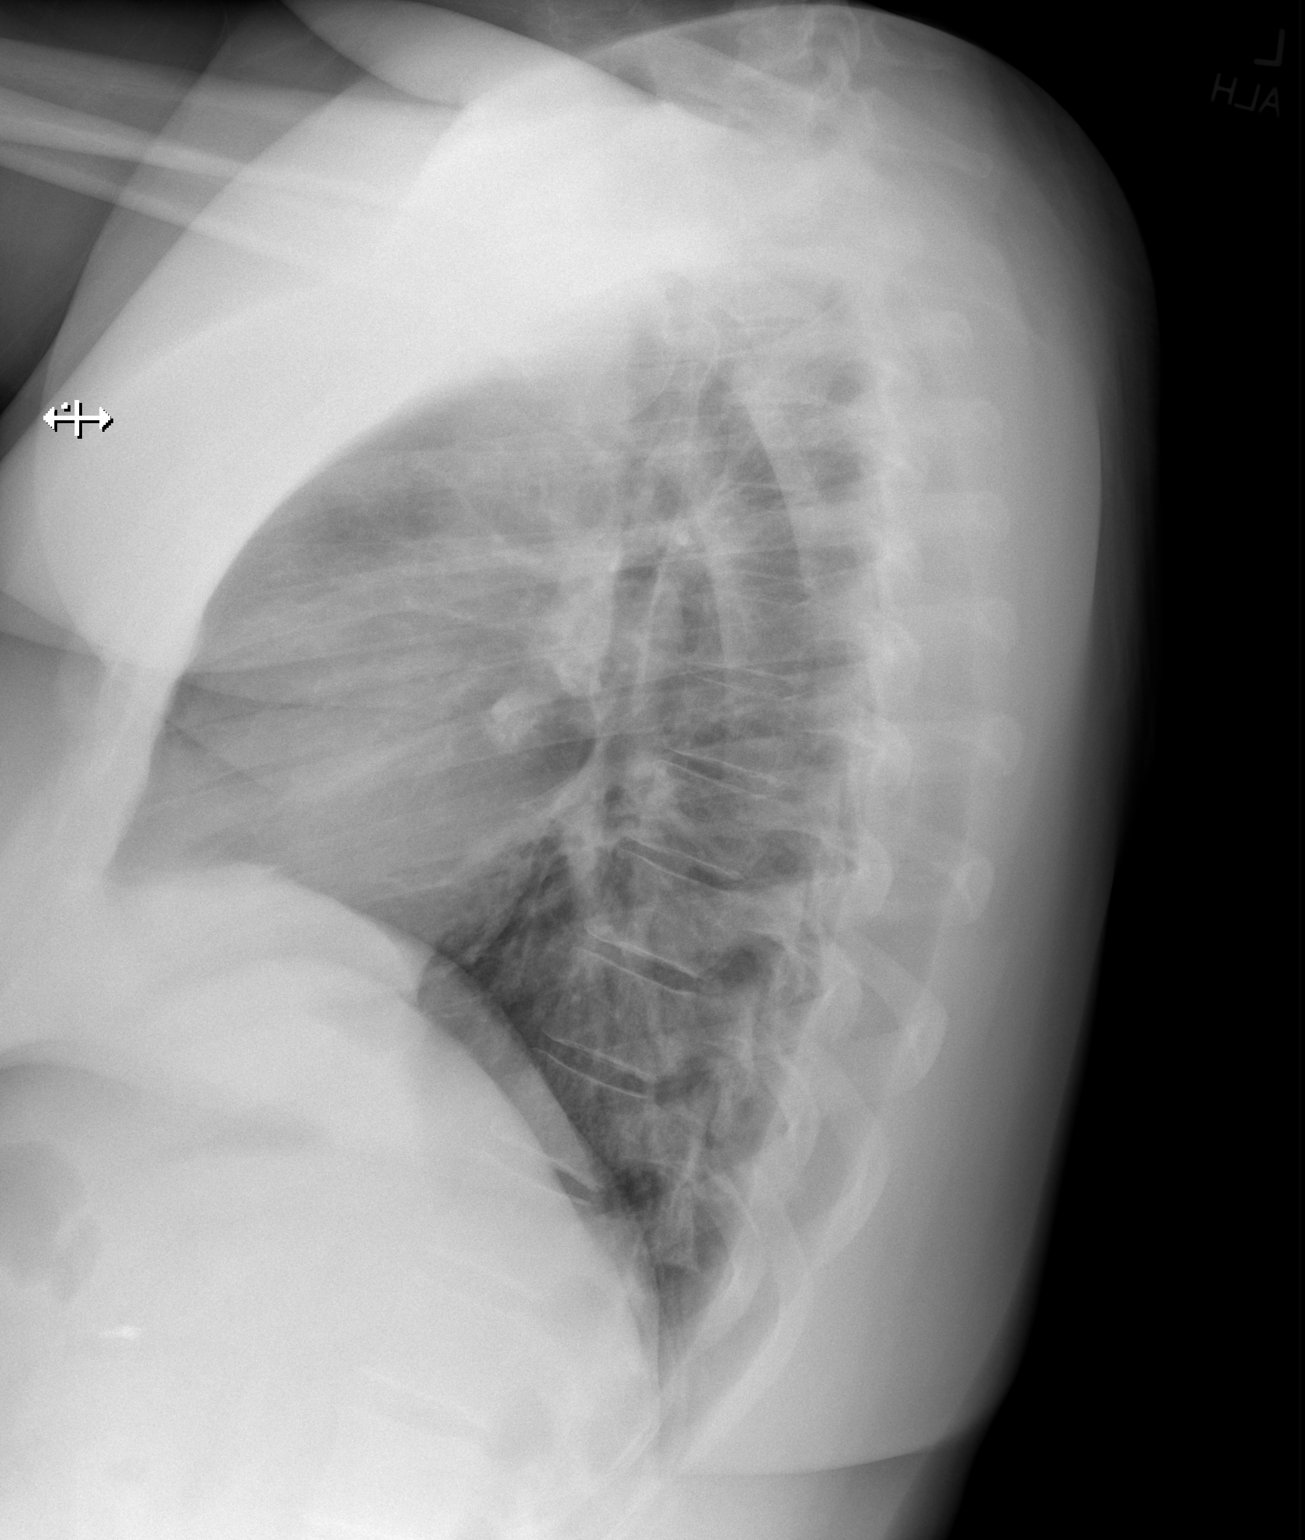

[2 of 2 positions shown; findings below may reference images not displayed]

FINDINGS: Lungs are clear. Heart size and pulmonary vascularity are normal. No
adenopathy. No pneumothorax. No bone lesions.
IMPRESSION: Lungs clear.  Cardiac silhouette normal.

## 2021-09-14 ENCOUNTER — Ambulatory Visit: Payer: 59 | Admitting: Internal Medicine

## 2022-01-02 ENCOUNTER — Encounter (INDEPENDENT_AMBULATORY_CARE_PROVIDER_SITE_OTHER): Payer: Self-pay

## 2022-04-23 ENCOUNTER — Other Ambulatory Visit: Payer: Self-pay | Admitting: Nurse Practitioner

## 2022-04-23 DIAGNOSIS — Z1231 Encounter for screening mammogram for malignant neoplasm of breast: Secondary | ICD-10-CM

## 2022-05-06 ENCOUNTER — Telehealth: Payer: Self-pay | Admitting: Internal Medicine

## 2022-05-06 ENCOUNTER — Ambulatory Visit: Payer: 59 | Admitting: Internal Medicine

## 2022-05-06 NOTE — Telephone Encounter (Signed)
Good Morning Dr. Lorenso Courier,   Patient called stating that she needed to reschedule her appointment with you today at 3:20 due to patient being sick.   Patient was rescheduled for 01/18 at 10:30

## 2022-06-13 ENCOUNTER — Encounter: Payer: Self-pay | Admitting: Internal Medicine

## 2022-06-13 ENCOUNTER — Other Ambulatory Visit (INDEPENDENT_AMBULATORY_CARE_PROVIDER_SITE_OTHER): Payer: Commercial Managed Care - PPO

## 2022-06-13 ENCOUNTER — Ambulatory Visit: Payer: Commercial Managed Care - PPO | Admitting: Internal Medicine

## 2022-06-13 DIAGNOSIS — R7989 Other specified abnormal findings of blood chemistry: Secondary | ICD-10-CM | POA: Diagnosis not present

## 2022-06-13 DIAGNOSIS — R1012 Left upper quadrant pain: Secondary | ICD-10-CM

## 2022-06-13 DIAGNOSIS — K5900 Constipation, unspecified: Secondary | ICD-10-CM

## 2022-06-13 DIAGNOSIS — R1310 Dysphagia, unspecified: Secondary | ICD-10-CM | POA: Diagnosis not present

## 2022-06-13 DIAGNOSIS — K219 Gastro-esophageal reflux disease without esophagitis: Secondary | ICD-10-CM

## 2022-06-13 DIAGNOSIS — K625 Hemorrhage of anus and rectum: Secondary | ICD-10-CM

## 2022-06-13 LAB — COMPREHENSIVE METABOLIC PANEL
ALT: 103 U/L — ABNORMAL HIGH (ref 0–35)
AST: 81 U/L — ABNORMAL HIGH (ref 0–37)
Albumin: 4.4 g/dL (ref 3.5–5.2)
Alkaline Phosphatase: 103 U/L (ref 39–117)
BUN: 10 mg/dL (ref 6–23)
CO2: 27 mEq/L (ref 19–32)
Calcium: 9.5 mg/dL (ref 8.4–10.5)
Chloride: 103 mEq/L (ref 96–112)
Creatinine, Ser: 0.8 mg/dL (ref 0.40–1.20)
GFR: 90.3 mL/min (ref >60.00)
Glucose, Bld: 90 mg/dL (ref 70–99)
Potassium: 3.9 mEq/L (ref 3.5–5.1)
Sodium: 138 mEq/L (ref 135–145)
Total Bilirubin: 0.9 mg/dL (ref 0.2–1.2)
Total Protein: 7.5 g/dL (ref 6.0–8.3)

## 2022-06-13 LAB — CBC WITH DIFFERENTIAL/PLATELET
Basophils Absolute: 0 10*3/uL (ref 0.0–0.1)
Basophils Relative: 0.3 % (ref 0.0–3.0)
Eosinophils Absolute: 0.2 10*3/uL (ref 0.0–0.7)
Eosinophils Relative: 3 % (ref 0.0–5.0)
HCT: 38.9 % (ref 36.0–46.0)
Hemoglobin: 13.4 g/dL (ref 12.0–15.0)
Lymphocytes Relative: 20.8 % (ref 12.0–46.0)
Lymphs Abs: 1.2 10*3/uL (ref 0.7–4.0)
MCHC: 34.3 g/dL (ref 30.0–36.0)
MCV: 84.2 fl (ref 78.0–100.0)
Monocytes Absolute: 0.3 10*3/uL (ref 0.1–1.0)
Monocytes Relative: 5.1 % (ref 3.0–12.0)
Neutro Abs: 4.2 10*3/uL (ref 1.4–7.7)
Neutrophils Relative %: 70.8 % (ref 43.0–77.0)
Platelets: 187 10*3/uL (ref 150.0–400.0)
RBC: 4.62 Mil/uL (ref 3.87–5.11)
RDW: 13.9 % (ref 11.5–15.5)
WBC: 5.9 10*3/uL (ref 4.0–10.5)

## 2022-06-13 LAB — TSH: TSH: 5.31 u[IU]/mL (ref 0.35–5.50)

## 2022-06-13 LAB — PROTIME-INR
INR: 1.2 ratio — ABNORMAL HIGH (ref 0.8–1.0)
Prothrombin Time: 12.7 s (ref 9.6–13.1)

## 2022-06-13 MED ORDER — NA SULFATE-K SULFATE-MG SULF 17.5-3.13-1.6 GM/177ML PO SOLN: 1.0000 | ORAL | 0 refills | Status: DC

## 2022-06-13 NOTE — Patient Instructions (Addendum)
If you are age 44 or younger, your body mass index should be between 44-44. Your Body mass index is 46.95 kg/m. If this is out of the aformentioned range listed, please consider follow up with your Primary Care Provider.  ________________________________________________________  The [place] [facility] providers would like to encourage you to use [facility] to communicate with providers for non-urgent requests or questions.  Due to long hold times on the telephone, sending your provider a message by [facility] may be a faster and more efficient way to get a response.  Please allow 48 business hours for a response.  Please remember that this is for non-urgent requests.  _______________________________________________________  Your provider has requested that you go to the basement level for lab work before leaving today. Press "B" on the elevator. The lab is located at the first door on the left as you exit the elevator.  You have been scheduled for a CT scan of the abdomen and pelvis at [facility], 1st floor Radiology. You are scheduled on 06-21-22 at 4:00 pm. You should arrive at 2:00 pm for registration and to drink contrast. Please follow the written instructions below on the day of your exam:   1) Do not eat anything after 12:00 pm (4 hours prior to your test)   The purpose of you drinking the oral contrast is to aid in the visualization of your intestinal tract. The contrast solution may cause some diarrhea. Depending on your individual set of symptoms, you may also receive an intravenous injection of x-ray contrast/dye. Plan on being at [facility] for 45 minutes or longer, depending on the type of exam you are having performed.   If you have any questions regarding your exam or if you need to reschedule, you may call [facility] at [phone] between the hours of 8:00 am and 5:00 pm, Monday-Friday.   You have been scheduled for an endoscopy and colonoscopy. Please follow the written  instructions given to you at your visit today. Please pick up your prep supplies at the pharmacy within the next 1-3 days. If you use inhalers (even only as needed), please bring them with you on the day of your procedure.  Due to recent changes in healthcare laws, you may see the results of your imaging and laboratory studies on MyChart before your provider has had a chance to review them.  We understand that in some cases there may be results that are confusing or concerning to you. Not all laboratory results come back in the same time frame and the provider may be waiting for multiple results in order to interpret others.  Please give [place] 48 hours in order for your provider to thoroughly review all the results before contacting the office for clarification of your results.   Drink 8 cups of water a day and walk 30 minutes a day.  Please purchase the following medications over the counter and take as directed: Fiber supplement such as Benefiber- use as directed daily Miralax: Take as  directed up to 3 times a day to achieve regular bowel movements  Thank you for entrusting me with your care and choosing [facility].  Dr Lorenso Courier

## 2022-06-13 NOTE — Progress Notes (Signed)
Hi [name] [name], please call the patient to let her know that because her liver tests are continuing to increase, I would recommend that she get an elastography test (ultrasound) to see how advanced her liver disease is. If she would like a referral to weight loss clinic, we could provide one to her for the [facility] in [place].

## 2022-06-13 NOTE — Progress Notes (Signed)
Chief Complaint: Abdominal pain  HPI : 42 year old female with history of GERD, chronic bronchitis, fibroids, fatty liver, obesity presents with ab pain  Her ab pain started 3 months ago and is located in the LUQ area. Sometimes she will feel like something is gnawing in that area. Denies any inciting factors for the start of the ab pain. When she eats, the pain sometimes gets worse. She has had GERD for a long time. She has been gaining a lot of weight over the last 6-7 months. Endorses bloating. Endorses nausea and vomiting with eating certain foods (particularly anything with a red sauce or rich foods). Endorses chest burning and regurgitation at times. Endorses dysphagia every once in a while. She occasionally sees red blood in her stool. Denies alcohol use. Occasionally she will use marijuana, which she started recently to help with nausea. Endorses constipation. She will have a BM once every 1-3 days on average. She has family history of liver cancer and liver disease in mother (fatty liver disease). Denies prior EGD. Her last colonoscopy was about 10 years ago.  Wt Readings from Last 3 Encounters:  06/13/22 273 lb 8 oz (124.1 kg)  04/26/17 252 lb (114.3 kg)  04/20/16 252 lb (114.3 kg)   Past Medical History:  Diagnosis Date   Allergy    Anxiety    Arthritis    Chronic bronchitis (HCC)    Depression    as a teenager   Fatty liver    Fibroid    GERD (gastroesophageal reflux disease)    Hyperlipidemia    Hypertension    pt not meds at this time   Obesity    Preterm premature rupture of membranes (PPROM) with unknown onset of labor 11/22/2013   Vaginal delivery 12/2013   Past Surgical History:  Procedure Laterality Date   ABDOMINAL CERCLAGE N/A 09/08/2013   Procedure: LAPAROSCOPIC TRANSABDOMINAL CERCLAGE;  Surgeon: Governor Specking, MD;  Location: [facility];  Service: Gynecology;  Laterality: N/A;   CESAREAN SECTION N/A 11/28/2013   Procedure: CESAREAN SECTION, hysterotomy and  circlage removal;  Surgeon: Cyril Mourning, MD;  Location: [facility];  Service: Obstetrics;  Laterality: N/A;   CHOLECYSTECTOMY  2008   LAPAROSCOPY  09/08/2013   Procedure: LAPAROSCOPY OPERATIVE;  Surgeon: Governor Specking, MD;  Location: [facility];  Service: Gynecology;;   transvaginal cerclage  2011   WISDOM TOOTH EXTRACTION  2010   Family History  Problem Relation Age of Onset   Liver disease Mother    Liver cancer Mother    Hypertension Father    Kidney disease Father    Heart disease Father    Stroke Father    Bone cancer Father    Social History   Tobacco Use   Smoking status: Former   Smokeless tobacco: Never   Tobacco comments:    quit  2003  Substance Use Topics   Alcohol use: No   Drug use: No   Current Outpatient Medications  Medication Sig Dispense Refill   fluticasone (FLONASE) 50 MCG/ACT nasal spray Place 2 sprays into both nostrils daily. 16 g 12   No current facility-administered medications for this visit.   Allergies  Allergen Reactions   Clindamycin/Lincomycin Hives   Penicillins Hives   Review of Systems: All systems reviewed and negative except where noted in HPI.   Physical Exam: BP 136/86 (BP Location: Right Arm, Patient Position: Sitting, Cuff Size: Large)   Pulse 95   Ht '5\' 4"'$  (1.626 m)  Wt 273 lb 8 oz (124.1 kg)   SpO2 98%   BMI 46.95 kg/m  Constitutional: Pleasant,well-developed, female in no acute distress. HEENT: Normocephalic and atraumatic. Conjunctivae are normal. No scleral icterus. Cardiovascular: Normal rate, regular rhythm.  Pulmonary/chest: Effort normal and breath sounds normal. No wheezing, rales or rhonchi. Abdominal: Soft, nondistended, tender in the LUQ. Bowel sounds active throughout. There are no masses palpable. No hepatomegaly. Extremities: No edema Neurological: Alert and oriented to person place and time. Skin: Skin is warm and dry. No rashes noted. Psychiatric: Normal mood and affect. Behavior is normal.  Labs  04/26/17: CMP with mildly elevated AST of 41 and ALT of 59.   Colonoscopy 09/18/12:   ASSESSMENT AND PLAN: LUQ abdominal pain Elevated LFTs Dysphagia GERD Constipation Rectal bleeding Patient presents with LUQ ab pain over the last 3 months. She also notes some GERD, dysphagia, and constipation. She is medication averse so she would prefer to use conservative therapies if possible. Her symptoms may be related to uncontrolled GERD and constipation so will start some conservative therapies for this. Will obtain labs and CT imaging for further evaluation. For the patient's dysphagia and ab pain, will also plan for EGD for further evaluation. Will plan for colonoscopy for further evaluation of the patient's rectal bleeding. - GERD handout - Aim to drink 8 cups of water per day, walk 30 min per day, and take a daily fiber supplement - Miralax PRN - Check CBC, CMP, INR, TSH - CT A/P w/contrast - EGD/colonoscopy [facility]  Christia Reading, MD  I spent 51 minutes of time, including in depth chart review, independent review of results as outlined above, communicating results with the patient directly, face-to-face time with the patient, coordinating care, ordering studies and medications as appropriate, and documentation.

## 2022-06-14 ENCOUNTER — Other Ambulatory Visit: Payer: Self-pay

## 2022-06-14 DIAGNOSIS — K760 Fatty (change of) liver, not elsewhere classified: Secondary | ICD-10-CM

## 2022-06-14 DIAGNOSIS — R7989 Other specified abnormal findings of blood chemistry: Secondary | ICD-10-CM

## 2022-06-17 ENCOUNTER — Telehealth: Payer: Self-pay | Admitting: Internal Medicine

## 2022-06-17 NOTE — Telephone Encounter (Signed)
Inbound call from [place] with [facility] calling to inform us that they will not be able to get patient scheduled for ultrasound elastography until April. She wanted to inform us just in case we would like to send her elsewhere. Please advise.

## 2022-06-20 NOTE — Telephone Encounter (Signed)
Dr. Lorenso Courier, is this time frame OK? Pt is scheduled for CT scan tomorrow. Please advise, thanks.

## 2022-06-21 ENCOUNTER — Ambulatory Visit (HOSPITAL_COMMUNITY)
Admission: RE | Admit: 2022-06-21 | Discharge: 2022-06-21 | Disposition: A | Discharge status: Home / Self Care | Payer: Commercial Managed Care - PPO | Source: Ambulatory Visit | Attending: Internal Medicine | Admitting: Internal Medicine

## 2022-06-21 DIAGNOSIS — K219 Gastro-esophageal reflux disease without esophagitis: Secondary | ICD-10-CM | POA: Insufficient documentation

## 2022-06-21 DIAGNOSIS — K625 Hemorrhage of anus and rectum: Secondary | ICD-10-CM | POA: Insufficient documentation

## 2022-06-21 DIAGNOSIS — R7989 Other specified abnormal findings of blood chemistry: Secondary | ICD-10-CM

## 2022-06-21 DIAGNOSIS — K5900 Constipation, unspecified: Secondary | ICD-10-CM | POA: Diagnosis present

## 2022-06-21 DIAGNOSIS — R1012 Left upper quadrant pain: Secondary | ICD-10-CM | POA: Diagnosis present

## 2022-06-21 DIAGNOSIS — R1310 Dysphagia, unspecified: Secondary | ICD-10-CM | POA: Diagnosis present

## 2022-06-21 MED ORDER — IOHEXOL 300 MG/ML  SOLN
100.0000 mL | Freq: Once | INTRAMUSCULAR | Status: AC | PRN
  Administered 2022-06-21: 100 mL via INTRAVENOUS

## 2022-06-24 ENCOUNTER — Encounter: Payer: Self-pay | Admitting: Internal Medicine

## 2022-06-26 ENCOUNTER — Encounter: Payer: Self-pay | Admitting: Internal Medicine

## 2022-06-26 NOTE — Telephone Encounter (Signed)
Called and spoke to the patient about the results of her CT scan. We will await the results of her upcoming elastography test to see if she has significant liver fibrosis. She is not drinking any alcohol, and she is starting to work out in order to help with weight loss. I also encouraged her to drink coffee with 2-3 cups per day if possible. I told her that the kidney stone is non-obstructive so she should try to stay hydrated. If she ever develops severe abdominal or back pain, the kidney stone may be causing her pain. She does have a cyst on her ovary, and I recommended that she reach out to her OB/GYN in order to get a follow up pelvic ultrasound in 3-6 months.

## 2022-06-27 ENCOUNTER — Other Ambulatory Visit: Payer: Self-pay

## 2022-06-27 DIAGNOSIS — K760 Fatty (change of) liver, not elsewhere classified: Secondary | ICD-10-CM

## 2022-06-27 DIAGNOSIS — R7989 Other specified abnormal findings of blood chemistry: Secondary | ICD-10-CM

## 2022-07-04 ENCOUNTER — Encounter: Payer: Self-pay | Admitting: Internal Medicine

## 2022-07-04 ENCOUNTER — Ambulatory Visit (HOSPITAL_COMMUNITY)
Admission: RE | Admit: 2022-07-04 | Discharge: 2022-07-04 | Disposition: A | Discharge status: Home / Self Care | Payer: Commercial Managed Care - PPO | Source: Ambulatory Visit | Attending: Internal Medicine | Admitting: Internal Medicine

## 2022-07-04 DIAGNOSIS — R7989 Other specified abnormal findings of blood chemistry: Secondary | ICD-10-CM | POA: Diagnosis present

## 2022-07-04 DIAGNOSIS — K760 Fatty (change of) liver, not elsewhere classified: Secondary | ICD-10-CM | POA: Insufficient documentation

## 2022-08-07 ENCOUNTER — Encounter: Payer: Self-pay | Admitting: Internal Medicine

## 2022-08-15 ENCOUNTER — Encounter: Payer: Self-pay | Admitting: Internal Medicine

## 2022-08-15 ENCOUNTER — Ambulatory Visit (AMBULATORY_SURGERY_CENTER): Payer: Commercial Managed Care - PPO | Admitting: Internal Medicine

## 2022-08-15 VITALS — BP 97/67 | HR 81 | Temp 98.6°F | Resp 21 | Ht 64.0 in | Wt 273.0 lb

## 2022-08-15 DIAGNOSIS — K602 Anal fissure, unspecified: Secondary | ICD-10-CM | POA: Diagnosis not present

## 2022-08-15 DIAGNOSIS — K317 Polyp of stomach and duodenum: Secondary | ICD-10-CM | POA: Diagnosis not present

## 2022-08-15 DIAGNOSIS — K625 Hemorrhage of anus and rectum: Secondary | ICD-10-CM | POA: Diagnosis present

## 2022-08-15 DIAGNOSIS — R1012 Left upper quadrant pain: Secondary | ICD-10-CM

## 2022-08-15 DIAGNOSIS — K31A11 Gastric intestinal metaplasia without dysplasia, involving the antrum: Secondary | ICD-10-CM | POA: Diagnosis not present

## 2022-08-15 DIAGNOSIS — K2970 Gastritis, unspecified, without bleeding: Secondary | ICD-10-CM | POA: Diagnosis not present

## 2022-08-15 DIAGNOSIS — K5900 Constipation, unspecified: Secondary | ICD-10-CM

## 2022-08-15 DIAGNOSIS — K3189 Other diseases of stomach and duodenum: Secondary | ICD-10-CM | POA: Diagnosis not present

## 2022-08-15 MED ORDER — OMEPRAZOLE 40 MG PO CPDR: 40.0000 mg | DELAYED_RELEASE_CAPSULE | Freq: Two times a day (BID) | ORAL | 1 refills | Status: DC

## 2022-08-15 MED ORDER — SODIUM CHLORIDE 0.9 % IV SOLN: 500.0000 mL | Freq: Once | INTRAVENOUS | Status: DC

## 2022-08-15 NOTE — Op Note (Signed)
[place] Patient Name: [name] [name] Procedure Date: 08/15/2022 9:31 AM MRN: [id number] Endoscopist: Adline Mango [place] , , [id number] Age: 42 Referring MD:  Date of Birth: 04-08-1981 Gender: Female Account #: [id number] Procedure:                Colonoscopy Indications:              Rectal bleeding Medicines:                Monitored Anesthesia Care Procedure:                Pre-Anesthesia Assessment:                           - Prior to the procedure, a History and Physical                            was performed, and patient medications and                            allergies were reviewed. The patient's tolerance of                            previous anesthesia was also reviewed. The risks                            and benefits of the procedure and the sedation                            options and risks were discussed with the patient.                            All questions were answered, and informed consent                            was obtained. Prior Anticoagulants: The patient has                            taken no anticoagulant or antiplatelet agents. ASA                            Grade Assessment: II - A patient with mild systemic                            disease. After reviewing the risks and benefits,                            the patient was deemed in satisfactory condition to                            undergo the procedure.                           After obtaining informed consent, the colonoscope  was passed under direct vision. Throughout the                            procedure, the patient's blood pressure, pulse, and                            oxygen saturations were monitored continuously. The                            Olympus [id number] SN [id number] was introduced through                            the anus and advanced to the the cecum, identified                            by appendiceal orifice and  ileocecal valve. The                            colonoscopy was performed without difficulty. The                            patient tolerated the procedure well. The quality                            of the bowel preparation was adequate. The                            ileocecal valve, appendiceal orifice, and rectum                            were photographed. Scope In: 9:47:21 AM Scope Out: 10:03:54 AM Scope Withdrawal Time: 0 hours 14 minutes 49 seconds  Total Procedure Duration: 0 hours 16 minutes 33 seconds  Findings:                 Non-bleeding internal hemorrhoids were found during                            retroflexion.                           A healing anal fissure was found on perianal exam. Complications:            No immediate complications. Estimated Blood Loss:     Estimated blood loss: none. Impression:               - Non-bleeding internal hemorrhoids.                           - A healing anal fissure was found on perianal exam.                           - No specimens collected. Recommendation:           - Discharge patient to home (with escort).                           -  Repeat colonoscopy in 10 years for screening                            purposes.                           - The findings and recommendations were discussed                            with the patient. Dr Georgian Co "Lyndee Leo" Lorenso Courier,  08/15/2022 10:15:36 AM

## 2022-08-15 NOTE — Patient Instructions (Signed)
Handout provided on gastritis.   Resume previous diet.  Continue present medications.  Use Prilosec (omeprazole) 40mg  by mouth twice daily for 8 weeks.  Return to GI clinic in 6 weeks. See appointment scheduled. Please call to change the appointment if needed.  Repeat colonoscopy in 10 years.   YOU HAD AN ENDOSCOPIC PROCEDURE TODAY AT [facility]:   Refer to the procedure report that was given to you for any specific questions about what was found during the examination.  If the procedure report does not answer your questions, please call your gastroenterologist to clarify.  If you requested that your care partner not be given the details of your procedure findings, then the procedure report has been included in a sealed envelope for you to review at your convenience later.  YOU SHOULD EXPECT: Some feelings of bloating in the abdomen. Passage of more gas than usual.  Walking can help get rid of the air that was put into your GI tract during the procedure and reduce the bloating. If you had a lower endoscopy (such as a colonoscopy or flexible sigmoidoscopy) you may notice spotting of blood in your stool or on the toilet paper. If you underwent a bowel prep for your procedure, you may not have a normal bowel movement for a few days.  Please Note:  You might notice some irritation and congestion in your nose or some drainage.  This is from the oxygen used during your procedure.  There is no need for concern and it should clear up in a day or so.  SYMPTOMS TO REPORT IMMEDIATELY:  Following lower endoscopy (colonoscopy or flexible sigmoidoscopy):  Excessive amounts of blood in the stool  Significant tenderness or worsening of abdominal pains  Swelling of the abdomen that is new, acute  Fever of 100F or higher  Following upper endoscopy (EGD)  Vomiting of blood or coffee ground material  New chest pain or pain under the shoulder blades  Painful or persistently difficult  swallowing  New shortness of breath  Fever of 100F or higher  Black, tarry-looking stools  For urgent or emergent issues, a gastroenterologist can be reached at any hour by calling [phone]. Do not use MyChart messaging for urgent concerns.    DIET:  We do recommend a small meal at first, but then you may proceed to your regular diet.  Drink plenty of fluids but you should avoid alcoholic beverages for 24 hours.  ACTIVITY:  You should plan to take it easy for the rest of today and you should NOT DRIVE or use heavy machinery until tomorrow (because of the sedation medicines used during the test).    FOLLOW UP: Our staff will call the number listed on your records the next business day following your procedure.  We will call around 7:15- 8:00 am to check on you and address any questions or concerns that you may have regarding the information given to you following your procedure. If we do not reach you, we will leave a message.     If any biopsies were taken you will be contacted by phone or by letter within the next 1-3 weeks.  Please call us at [phone] if you have not heard about the biopsies in 3 weeks.    SIGNATURES/CONFIDENTIALITY: You and/or your care partner have signed paperwork which will be entered into your electronic medical record.  These signatures attest to the fact that that the information above on your After Visit Summary has  been reviewed and is understood.  Full responsibility of the confidentiality of this discharge information lies with you and/or your care-partner.

## 2022-08-15 NOTE — Op Note (Signed)
[place] Patient Name: [name] [name] Procedure Date: 08/15/2022 9:32 AM MRN: [id number] Endoscopist: Adline Mango [place] , , [id number] Age: 42 Referring MD:  Date of Birth: 1981-05-23 Gender: Female Account #: [id number] Procedure:                Upper GI endoscopy Indications:              Abdominal pain in the left upper quadrant,                            Dysphagia, Heartburn Medicines:                Monitored Anesthesia Care Procedure:                Pre-Anesthesia Assessment:                           - Prior to the procedure, a History and Physical                            was performed, and patient medications and                            allergies were reviewed. The patient's tolerance of                            previous anesthesia was also reviewed. The risks                            and benefits of the procedure and the sedation                            options and risks were discussed with the patient.                            All questions were answered, and informed consent                            was obtained. Prior Anticoagulants: The patient has                            taken no anticoagulant or antiplatelet agents. ASA                            Grade Assessment: II - A patient with mild systemic                            disease. After reviewing the risks and benefits,                            the patient was deemed in satisfactory condition to                            undergo the procedure.  After obtaining informed consent, the endoscope was                            passed under direct vision. Throughout the                            procedure, the patient's blood pressure, pulse, and                            oxygen saturations were monitored continuously. The                            Endoscope Olympus [id number] was introduced through                            the mouth, and advanced to the  second part of                            duodenum. The upper GI endoscopy was accomplished                            without difficulty. The patient tolerated the                            procedure well. Scope In: Scope Out: Findings:                 The examined esophagus was normal. Biopsies were                            taken with a cold forceps for histology.                           Localized inflammation characterized by congestion                            (edema), erosions and erythema was found in the                            gastric body and in the gastric antrum. Biopsies                            were taken with a cold forceps for histology.                           Multiple 3 to 7 mm sessile polyps with no bleeding                            and no stigmata of recent bleeding were found in                            the gastric body. These polyps were removed with a  cold snare. Resection and retrieval were complete.                           The examined duodenum was normal. Complications:            No immediate complications. Estimated Blood Loss:     Estimated blood loss was minimal. Impression:               - Normal esophagus. Biopsied.                           - Gastritis. Biopsied.                           - Multiple gastric polyps. Resected and retrieved.                           - Normal examined duodenum. Recommendation:           - Await pathology results.                           - Use Prilosec (omeprazole) 40 mg PO BID for 8                            weeks.                           - Return to GI clinic in 6 weeks.                           - Perform a colonoscopy today. Dr Georgian Co "[place]" [place],  08/15/2022 10:11:23 AM

## 2022-08-15 NOTE — Progress Notes (Signed)
VS by [place]  Pt's states no medical or surgical changes since previsit or office visit.  

## 2022-08-15 NOTE — Progress Notes (Signed)
GASTROENTEROLOGY PROCEDURE H&P NOTE   Primary Care Physician: Leonard Downing, MD    Reason for Procedure:   Dysphagia, LUQ ab pain, GERD, rectal bleeding  Plan:    EGD/colonoscopy  Patient is appropriate for endoscopic procedure(s) in the ambulatory ([facility]) setting.  The nature of the procedure, as well as the risks, benefits, and alternatives were carefully and thoroughly reviewed with the patient. Ample time for discussion and questions allowed. The patient understood, was satisfied, and agreed to proceed.     HPI: [name] [name] is a 42 y.o. female who presents for EGD/colonoscopy for evaluation of dysphagia, LUQ ab pain, GERD, and rectal bleeding .  Patient was most recently seen in the Gastroenterology Clinic on 06/13/22.  No interval change in medical history since that appointment. Please refer to that note for full details regarding GI history and clinical presentation.   Past Medical History:  Diagnosis Date   Allergy    Anxiety    Arthritis    Chronic bronchitis ([place])    Depression    as a teenager   Fatty liver    Fibroid    GERD (gastroesophageal reflux disease)    Hyperlipidemia    Hypertension    pt not meds at this time   Obesity    Preterm premature rupture of membranes (PPROM) with unknown onset of labor 11/22/2013   Vaginal delivery 12/2013    Past Surgical History:  Procedure Laterality Date   ABDOMINAL CERCLAGE N/A 09/08/2013   Procedure: LAPAROSCOPIC TRANSABDOMINAL CERCLAGE;  Surgeon: Governor Specking, MD;  Location: [facility];  Service: Gynecology;  Laterality: N/A;   CESAREAN SECTION N/A 11/28/2013   Procedure: CESAREAN SECTION, hysterotomy and circlage removal;  Surgeon: Cyril Mourning, MD;  Location: [facility];  Service: Obstetrics;  Laterality: N/A;   CHOLECYSTECTOMY  2008   LAPAROSCOPY  09/08/2013   Procedure: LAPAROSCOPY OPERATIVE;  Surgeon: Governor Specking, MD;  Location: [facility];  Service: Gynecology;;   transvaginal cerclage  2011    WISDOM TOOTH EXTRACTION  2010    Prior to Admission medications   Medication Sig Start Date End Date Taking? Authorizing Provider  fluticasone (FLONASE) 50 MCG/ACT nasal spray Place 2 sprays into both nostrils daily. 04/26/17  Yes Jeffery, Chelle, PA  loratadine (CLARITIN) 10 MG tablet Take by mouth.   Yes [provider]    Current Outpatient Medications  Medication Sig Dispense Refill   fluticasone (FLONASE) 50 MCG/ACT nasal spray Place 2 sprays into both nostrils daily. 16 g 12   loratadine (CLARITIN) 10 MG tablet Take by mouth.     Current Facility-Administered Medications  Medication Dose Route Frequency Provider Last Rate Last Admin   0.9 %  sodium chloride infusion  500 mL Intravenous Once Sharyn Creamer, MD        Allergies as of 08/15/2022 - Review Complete 08/15/2022  Allergen Reaction Noted   Clindamycin/lincomycin Hives 03/13/2012   Penicillins Hives     Family History  Problem Relation Age of Onset   Liver disease Mother    Liver cancer Mother    Hypertension Father    Kidney disease Father    Heart disease Father    Stroke Father    Bone cancer Father    Colon cancer Neg Hx    Esophageal cancer Neg Hx    Stomach cancer Neg Hx    Rectal cancer Neg Hx     Social History   Socioeconomic History   Marital status: Married  Spouse name: Not on file   Number of children: Not on file   Years of education: Not on file   Highest education level: Not on file  Occupational History   Occupation: Schedule coordinator at [employer]  Tobacco Use   Smoking status: Former   Smokeless tobacco: Never   Tobacco comments:    quit  2003  Vaping Use   Vaping Use: Never used  Substance and Sexual Activity   Alcohol use: No   Drug use: No   Sexual activity: Yes    Birth control/protection: Condom  Other Topics Concern   Not on file  Social History Narrative   Patient lives at home with her husband.   Social Determinants of Health   Financial  Resource Strain: Not on file  Food Insecurity: Not on file  Transportation Needs: Not on file  Physical Activity: Not on file  Stress: Not on file  Social Connections: Not on file  Intimate Partner Violence: Not on file    Physical Exam: Vital signs in last 24 hours: BP 113/75   Pulse 97   Temp 98.6 F (37 C)   Ht 5\' 4"  (1.626 m)   Wt 273 lb (123.8 kg)   LMP 07/24/2022   SpO2 97%   BMI 46.86 kg/m  GEN: NAD EYE: Sclerae anicteric ENT: MMM CV: Non-tachycardic Pulm: No increased WOB GI: Soft NEURO:  Alert & Oriented   Christia Reading, MD [facility]   08/15/2022 9:01 AM

## 2022-08-15 NOTE — Progress Notes (Signed)
Sedate, gd SR's, VSS, report to RN 

## 2022-08-15 NOTE — Progress Notes (Signed)
Called to room to assist during endoscopic procedure.  Patient ID and intended procedure confirmed with present staff. Received instructions for my participation in the procedure from the performing physician.  

## 2022-08-16 ENCOUNTER — Telehealth: Payer: Self-pay

## 2022-08-16 NOTE — Telephone Encounter (Signed)
Post procedure follow up call, no answer 

## 2022-08-21 ENCOUNTER — Encounter: Payer: Self-pay | Admitting: Internal Medicine

## 2022-09-26 ENCOUNTER — Ambulatory Visit: Payer: Commercial Managed Care - PPO | Admitting: Nurse Practitioner

## 2023-05-03 ENCOUNTER — Telehealth: Payer: Commercial Managed Care - PPO | Admitting: Family Medicine

## 2023-05-03 DIAGNOSIS — K219 Gastro-esophageal reflux disease without esophagitis: Secondary | ICD-10-CM | POA: Diagnosis not present

## 2023-05-03 MED ORDER — OMEPRAZOLE 20 MG PO CPDR: 20.0000 mg | DELAYED_RELEASE_CAPSULE | Freq: Every day | ORAL | 3 refills | Status: DC

## 2023-05-03 NOTE — Progress Notes (Signed)
E-Visit for Heartburn  We are sorry that you are not feeling well.  Here is how we plan to help!  Based on what you shared with me it looks like you most likely have Gastroesophageal Reflux Disease (GERD)  Gastroesophageal reflux disease (GERD) happens when acid from your stomach flows up into the esophagus.  When acid comes in contact with the esophagus, the acid causes sorenss (inflammation) in the esophagus.  Over time, GERD may create small holes (ulcers) in the lining of the esophagus.  I have prescribed Omeprazole 20 mg one by mouth daily until you follow up with a provider.  Your symptoms should improve in the next day or two.  You can use antacids as needed until symptoms resolve.  Call us if your heartburn worsens, you have trouble swallowing, weight loss, spitting up blood or recurrent vomiting.  Home Care: May include lifestyle changes such as weight loss, quitting smoking and alcohol consumption Avoid foods and drinks that make your symptoms worse, such as: Caffeine or alcoholic drinks Chocolate Peppermint or mint flavorings Garlic and onions Spicy foods Citrus fruits, such as oranges, lemons, or limes Tomato-based foods such as sauce, chili, salsa and pizza Fried and fatty foods Avoid lying down for 3 hours prior to your bedtime or prior to taking a nap Eat small, frequent meals instead of a large meals Wear loose-fitting clothing.  Do not wear anything tight around your waist that causes pressure on your stomach. Raise the head of your bed 6 to 8 inches with wood blocks to help you sleep.  Extra pillows will not help.  Seek Help Right Away If: You have pain in your arms, neck, jaw, teeth or back Your pain increases or changes in intensity or duration You develop nausea, vomiting or sweating (diaphoresis) You develop shortness of breath or you faint Your vomit is green, yellow, black or looks like coffee grounds or blood Your stool is red, bloody or black  These  symptoms could be signs of other problems, such as heart disease, gastric bleeding or esophageal bleeding.  Make sure you : Understand these instructions. Will watch your condition. Will get help right away if you are not doing well or get worse.  Your e-visit answers were reviewed by a board certified advanced clinical practitioner to complete your personal care plan.  Depending on the condition, your plan could have included both over the counter or prescription medications.  If there is a problem please reply  once you have received a response from your provider.  Your safety is important to [place].  If you have drug allergies check your prescription carefully.    You can use MyChart to ask questions about today's visit, request a non-urgent call back, or ask for a work or school excuse for 24 hours related to this e-Visit. If it has been greater than 24 hours you will need to follow up with your provider, or enter a new e-Visit to address those concerns.  You will get an e-mail in the next two days asking about your experience.  I hope that your e-visit has been valuable and will speed your recovery. Thank you for using e-visits.   have provided 5 minutes of non face to face time during this encounter for chart review and documentation.

## 2023-05-10 ENCOUNTER — Encounter: Payer: Self-pay | Admitting: Internal Medicine

## 2023-05-14 ENCOUNTER — Other Ambulatory Visit: Payer: Self-pay

## 2023-05-14 ENCOUNTER — Telehealth: Payer: Self-pay | Admitting: Internal Medicine

## 2023-05-14 MED ORDER — OMEPRAZOLE 40 MG PO CPDR: 40.0000 mg | DELAYED_RELEASE_CAPSULE | Freq: Two times a day (BID) | ORAL | 1 refills | Status: DC

## 2023-05-14 NOTE — Telephone Encounter (Signed)
Patient called and stated that she is needing a refill of Prilosec 40 MG. Please advise.

## 2024-03-10 ENCOUNTER — Telehealth: Payer: Self-pay | Admitting: Internal Medicine

## 2024-03-10 MED ORDER — OMEPRAZOLE 40 MG PO CPDR: 40.0000 mg | DELAYED_RELEASE_CAPSULE | Freq: Every day | ORAL | 3 refills | Status: DC

## 2024-03-10 NOTE — Telephone Encounter (Signed)
 Returned call to the patient to verify omeprazole  dosage and how she is currently taking it.  Patient stated she is taking Omeprazole  40mg  one capsule daily.    Patient advised she needs a follow up appointment for further refills.  She prefers to only see Dr Federico, and will plan to follow up with her in the office after she returns from maternity leave in January 2026.  Recall entered in EPIC.  Patient advised she can call our office at the end of November/early December to make an appointment.  Patient agreed to plan and verbalized understanding.  No further questions or concerns.

## 2024-03-10 NOTE — Telephone Encounter (Signed)
 Inbound call from patient stating she needs refill on medication omeprazole  40 mg. Patient she will wait until Dr.Dorsey is back to schedule appointment  Please advise  Thank you

## 2024-03-13 ENCOUNTER — Other Ambulatory Visit: Payer: Self-pay | Admitting: Internal Medicine

## 2024-03-15 ENCOUNTER — Telehealth: Payer: Self-pay | Admitting: Internal Medicine

## 2024-03-15 NOTE — Telephone Encounter (Signed)
 PT is calling about the omeprazole  prescription. She stated the pharmacy still hasn't received it. Please advise.

## 2024-03-15 NOTE — Telephone Encounter (Signed)
 Spoke with patient and verified that omeprazole  should be sent to [facility] in [place] on [address].  Patient advised to check with [facility] as medication was sent again this morning. Patient agreed to plan and verbalized understanding.

## 2024-06-23 ENCOUNTER — Encounter: Payer: Self-pay | Admitting: Internal Medicine

## 2024-07-06 ENCOUNTER — Encounter

## 2024-07-20 ENCOUNTER — Encounter: Admitting: Internal Medicine
